# Patient Record
Sex: Female | Born: 1960 | Race: Black or African American | Hispanic: No
Health system: Midwestern US, Community
[De-identification: ages and names within clinical notes are randomized; demographics above are authoritative.]

## PROBLEM LIST (undated history)

## (undated) DIAGNOSIS — Z1231 Encounter for screening mammogram for malignant neoplasm of breast: Secondary | ICD-10-CM

## (undated) DIAGNOSIS — K529 Noninfective gastroenteritis and colitis, unspecified: Secondary | ICD-10-CM

## (undated) DIAGNOSIS — K219 Gastro-esophageal reflux disease without esophagitis: Secondary | ICD-10-CM

## (undated) DIAGNOSIS — M069 Rheumatoid arthritis, unspecified: Secondary | ICD-10-CM

## (undated) DIAGNOSIS — I10 Essential (primary) hypertension: Secondary | ICD-10-CM

## (undated) HISTORY — PX: BREAST EXCISIONAL BIOPSY: SUR124

---

## 2008-10-12 LAB — CBC WITH AUTOMATED DIFF
ABS. BASOPHILS: 0.1 10*3/uL (ref 0.0–0.1)
ABS. LYMPHOCYTES: 1.7 10*3/uL (ref 0.8–3.5)
ABS. MONOCYTES: 0.3 10*3/uL (ref 0–1.0)
ABS. NEUTROPHILS: 3.5 10*3/uL (ref 1.8–8.0)
BASOPHILS: 1 % (ref 0–3)
EOSINOPHILS: 5 % (ref 0–5)
HCT: 37.3 % (ref 36.0–46.0)
HGB: 12.3 g/dL (ref 12.0–16.0)
LYMPHOCYTES: 28 % (ref 20–51)
MCH: 29.6 PG (ref 25.0–35.0)
MCHC: 33 g/dL (ref 31.0–37.0)
MCV: 89.6 FL (ref 78.0–102.0)
MONOCYTES: 6 % (ref 2–9)
MPV: 8.7 FL (ref 7.4–10.4)
NEUTROPHILS: 60 % (ref 42–75)
PLATELET: 243 10*3/uL (ref 130–400)
RBC: 4.16 M/uL (ref 4.10–5.10)
RDW: 12.9 % (ref 11.5–14.5)
WBC: 5.9 10*3/uL (ref 4.5–13.0)

## 2008-10-12 LAB — METABOLIC PANEL, BASIC
Anion gap: 10 mmol/L (ref 5–15)
BUN/Creatinine ratio: 16 (ref 12–20)
BUN: 11 MG/DL (ref 7–18)
CO2: 28 MMOL/L (ref 21–32)
Calcium: 9.3 MG/DL (ref 8.4–10.4)
Chloride: 110 MMOL/L — ABNORMAL HIGH (ref 100–108)
Creatinine: 0.7 MG/DL (ref 0.6–1.3)
GFR est AA: >60 mL/min/{1.73_m2} (ref >60)
GFR est non-AA: >60 mL/min/{1.73_m2} (ref >60)
Glucose: 92 MG/DL (ref 74–99)
Potassium: 4 MMOL/L (ref 3.5–5.5)
Sodium: 148 MMOL/L — ABNORMAL HIGH (ref 136–145)

## 2008-10-12 LAB — SED RATE (ESR): Sed rate (ESR): 43 MM/HR — ABNORMAL HIGH (ref 0–20)

## 2008-10-13 LAB — ANA BY MULTIPLEX FLOW IA, QL
ANA, Direct: NOT DETECTED
ANA: NOT DETECTED

## 2008-12-24 LAB — METABOLIC PANEL, BASIC
Anion gap: 7 mmol/L (ref 5–15)
BUN/Creatinine ratio: 14 (ref 12–20)
BUN: 14 MG/DL (ref 7–18)
CO2: 28 MMOL/L (ref 21–32)
Calcium: 9.4 MG/DL (ref 8.4–10.4)
Chloride: 105 MMOL/L (ref 100–108)
Creatinine: 1 MG/DL (ref 0.6–1.3)
GFR est AA: >60 mL/min/{1.73_m2} (ref >60)
GFR est non-AA: >60 mL/min/{1.73_m2} (ref >60)
Glucose: 87 MG/DL (ref 74–99)
Potassium: 3.9 MMOL/L (ref 3.5–5.5)
Sodium: 140 MMOL/L (ref 136–145)

## 2008-12-24 LAB — CBC WITH AUTOMATED DIFF
ABS. BASOPHILS: 0 10*3/uL (ref 0.0–0.1)
ABS. LYMPHOCYTES: 1.2 10*3/uL (ref 0.8–3.5)
ABS. MONOCYTES: 0.4 10*3/uL (ref 0–1.0)
ABS. NEUTROPHILS: 3.2 10*3/uL (ref 1.8–8.0)
BASOPHILS: 1 % (ref 0–3)
EOSINOPHILS: 7 % — ABNORMAL HIGH (ref 0–5)
HCT: 35.8 % — ABNORMAL LOW (ref 36.0–46.0)
HGB: 11.8 g/dL — ABNORMAL LOW (ref 12.0–16.0)
LYMPHOCYTES: 24 % (ref 20–51)
MCH: 29.8 PG (ref 25.0–35.0)
MCHC: 32.9 g/dL (ref 31.0–37.0)
MCV: 90.3 FL (ref 78.0–102.0)
MONOCYTES: 7 % (ref 2–9)
MPV: 8.7 FL (ref 7.4–10.4)
NEUTROPHILS: 61 % (ref 42–75)
PLATELET: 259 10*3/uL (ref 130–400)
RBC: 3.97 M/uL — ABNORMAL LOW (ref 4.10–5.10)
RDW: 13 % (ref 11.5–14.5)
WBC: 5.3 10*3/uL (ref 4.5–13.0)

## 2008-12-24 LAB — HEPATIC FUNCTION PANEL
A-G Ratio: 1 (ref 0.8–1.7)
ALT (SGPT): 24 U/L — ABNORMAL LOW (ref 30–65)
AST (SGOT): 16 U/L (ref 15–37)
Albumin: 3.8 g/dL (ref 3.4–5.0)
Alk. phosphatase: 171 U/L — ABNORMAL HIGH (ref 50–136)
Bilirubin, direct: 0.1 MG/DL (ref 0.0–0.3)
Bilirubin, total: 0.4 MG/DL (ref 0.1–0.9)
Globulin: 3.8 g/dL (ref 2.0–4.0)
Protein, total: 7.6 g/dL (ref 6.4–8.2)

## 2008-12-24 LAB — TSH 3RD GENERATION: TSH: 0.48 u[IU]/mL — ABNORMAL LOW (ref 0.51–6.27)

## 2008-12-24 LAB — FERRITIN: Ferritin: 63 NG/ML (ref 10–291)

## 2008-12-24 LAB — URIC ACID: Uric acid: 3.2 MG/DL (ref 2.6–7.2)

## 2008-12-24 LAB — LIPID PANEL
CHOL/HDL Ratio: 3.2 (ref 0–5.0)
Cholesterol, total: 205 MG/DL — ABNORMAL HIGH (ref 0–200)
HDL Cholesterol: 64 MG/DL — ABNORMAL HIGH (ref 40–60)
LDL, calculated: 129.2 MG/DL — ABNORMAL HIGH (ref 0–100)
Triglyceride: 59 MG/DL (ref 0–150)
VLDL, calculated: 11.8 MG/DL

## 2008-12-24 LAB — RPR
RPR: NONREACTIVE
RPR: NONREACTIVE

## 2008-12-24 LAB — RHEUMATOID FACTOR, QL: Rheumatoid factor, QL: 1:64 {titer} — AB

## 2008-12-26 LAB — HEPATITIS C AB
Hep C virus Ab Interp.: NEGATIVE
Hepatitis C virus Ab: 0.05 Index (ref <0.80)

## 2008-12-26 LAB — HIV 1/2 AB SCREEN W RFLX CONFIRM
HIV 1/2 Interpretation: NONREACTIVE
HIV1/2 INTERPRETATION, HHIVI: NONREACTIVE

## 2008-12-26 LAB — ANA BY MULTIPLEX FLOW IA, QL
ANA, Direct: NOT DETECTED
ANA: NOT DETECTED

## 2008-12-26 LAB — HEPATITIS C ANTIBODY
HCV Ab: 0.05 Index (ref <0.80)
Hepatitis C Ab: NEGATIVE

## 2010-05-01 LAB — METABOLIC PANEL, COMPREHENSIVE
A-G Ratio: 1.2 (ref 0.8–1.7)
ALT (SGPT): 28 U/L — ABNORMAL LOW (ref 30–65)
AST (SGOT): 8 U/L — ABNORMAL LOW (ref 15–37)
Albumin: 3.8 g/dL (ref 3.4–5.0)
Alk. phosphatase: 115 U/L (ref 50–136)
Anion gap: 8 mmol/L (ref 5–15)
BUN/Creatinine ratio: 14 (ref 12–20)
BUN: 14 MG/DL (ref 7–18)
Bilirubin, total: 0.3 MG/DL (ref 0.2–1.0)
CO2: 28 MMOL/L (ref 21–32)
Calcium: 8.8 MG/DL (ref 8.4–10.4)
Chloride: 107 MMOL/L (ref 100–108)
Creatinine: 1 MG/DL (ref 0.6–1.3)
GFR est AA: >60 mL/min/{1.73_m2} (ref >60)
GFR est non-AA: >60 mL/min/{1.73_m2} (ref >60)
Globulin: 3.3 g/dL (ref 2.0–4.0)
Glucose: 95 MG/DL (ref 74–99)
Potassium: 4.5 MMOL/L (ref 3.5–5.5)
Protein, total: 7.1 g/dL (ref 6.4–8.2)
Sodium: 143 MMOL/L (ref 136–145)

## 2010-05-01 LAB — CBC WITH AUTOMATED DIFF
ABS. BASOPHILS: 0 10*3/uL (ref 0.0–0.06)
ABS. EOSINOPHILS: 0.2 10*3/uL (ref 0.0–0.4)
ABS. LYMPHOCYTES: 3.3 10*3/uL (ref 0.9–3.6)
ABS. MONOCYTES: 0.3 10*3/uL (ref 0.05–1.2)
ABS. NEUTROPHILS: 3.5 10*3/uL (ref 1.8–8.0)
BASOPHILS: 0 % (ref 0–2)
EOSINOPHILS: 3 % (ref 0–5)
HCT: 37.2 % (ref 35.0–45.0)
HGB: 12.5 g/dL (ref 12.0–16.0)
LYMPHOCYTES: 45 % (ref 21–52)
MCH: 29.1 PG (ref 24.0–34.0)
MCHC: 33.6 g/dL (ref 31.0–37.0)
MCV: 86.7 FL (ref 74.0–97.0)
MONOCYTES: 4 % (ref 3–10)
MPV: 10.4 FL (ref 9.2–11.8)
NEUTROPHILS: 48 % (ref 40–73)
PLATELET: 270 10*3/uL (ref 135–420)
RBC: 4.29 M/uL (ref 4.20–5.30)
RDW: 14.4 % (ref 11.6–14.5)
WBC: 7.3 10*3/uL (ref 4.6–13.2)

## 2010-05-01 LAB — LIPID PANEL
CHOL/HDL Ratio: 3.8 (ref 0–5.0)
Cholesterol, total: 205 MG/DL — ABNORMAL HIGH (ref 0–200)
HDL Cholesterol: 54 MG/DL (ref 40–60)
LDL, calculated: 137 MG/DL — ABNORMAL HIGH (ref 0–100)
Triglyceride: 70 MG/DL (ref 0–150)
VLDL, calculated: 14 MG/DL

## 2010-05-01 LAB — TSH 3RD GENERATION: TSH: 1.79 u[IU]/mL (ref 0.51–6.27)

## 2010-05-03 LAB — VITAMIN D, 1, 25 DIHYDROXY: Calcitriol (Vit D 1, 25 di-OH): 64.3 pg/mL (ref 10.0–75.0)

## 2010-11-02 ENCOUNTER — Encounter

## 2010-12-18 MED ORDER — MIDAZOLAM 1 MG/ML IJ SOLN
1 mg/mL | INTRAMUSCULAR | Status: DC | PRN
Start: 2010-12-18 — End: 2010-12-18
  Administered 2010-12-18 (×2): via INTRAVENOUS

## 2010-12-18 MED ORDER — FLUMAZENIL 0.1 MG/ML IV SOLN
0.1 mg/mL | INTRAVENOUS | Status: DC | PRN
Start: 2010-12-18 — End: 2010-12-18

## 2010-12-18 MED ORDER — NALOXONE 0.4 MG/ML INJECTION
0.4 mg/mL | INTRAMUSCULAR | Status: DC | PRN
Start: 2010-12-18 — End: 2010-12-18

## 2010-12-18 MED ORDER — MEPERIDINE (PF) 100 MG/ML INJ SOLUTION
100 mg/ml | INTRAMUSCULAR | Status: DC | PRN
Start: 2010-12-18 — End: 2010-12-18
  Administered 2010-12-18: 16:00:00 via INTRAVENOUS

## 2010-12-18 MED ADMIN — meperidine (DEMEROL) 100 mg/ml injection: INTRAVENOUS | @ 16:00:00 | NDC 00409118069

## 2010-12-18 MED ADMIN — midazolam (VERSED) 1 mg/mL injection: INTRAVENOUS | @ 16:00:00 | NDC 00641605901

## 2010-12-18 MED ADMIN — 0.9% sodium chloride infusion 500 mL: INTRAVENOUS | @ 15:00:00 | NDC 00409798303

## 2010-12-18 NOTE — Procedures (Signed)
Colonoscopy Procedure Note    Patient: Nichole Lee MRN: [id number]  SSN: [id number]    Date of Birth: 04/09/1960  Age: 50 y.o.  Sex: female      Date of Procedure: 12/18/2010      Procedures:  COLONOSCOPY:   HISTORY UPDATE: There have been no significant clinical changes since the completion of the originally dated History and Physical.   INDICATION:  Screening colonoscopy   PROCEDURE PERFORMED:Colonoscopy   ENDOSCOPIST: Elberta Spaniel, MD   ASSISTANT:  Beacher May - Endoscopy Technician-1  Rondel Jumbo, RN - Endoscopy RN-1   CLASSIFICATION OF PREOPERATIVE RISK: ASA 2 - Patient with mild systemic disease with no functional limitations   ANESTHESIA:  Demerol 75 mg IV and Versed 3 mg IV   ENDOSCOPE:                                                                                                                 [x]   CF H 180AL   []   PCF H180AL   []   GIF H 180  []   CFQ 180 AL            EXTENT OF EXAM: Terminal ileum    QUALITY OF COLON PREPARATION:  good     DESCRIPTION OF PROCEDURE:  The procedure was discussed with the      patient including but not limited to IV conscious sedation, bleeding, perforation and missed polyps and the consent form was signed and witnessed.  A safety timeout was performed.  The patient was then given incremental doses of intravenous demerol and versed to achieve moderate conscious sedation.  The patient's vitals were monitored at all times, including heart rate and rhythm, oxygen saturation, and blood pressure.  The patient was then placed into the left lateral decubitus position.  A rectal exam was performed.  The Olympus Adult diagnostic colonscope was then passed under direct visualization with ease to the terminal ileum.  The cecum was identified by landmarks including Ileocecal valve, appendiceal orifice and crow's foot.  The scope was then slowly withdrawn while closely visualizing the mucosa in the rectum a retroflection was performed and distal rectum visualized.  The scope was then removed.  The patient was transferred to the recovery room in stable condition.      FINDINGS:1. Mild diverticulosis on the left side 2. Small internal hemorrhoids on retroflexion 3. Terminal ileum normal to 20 cm    EBL: None   SPECIMENS:None   IMPRESSION: 1. Mild diverticulosis on the left side 2. Small internal hemorrhoids on retroflexion 3. Terminal ileum normal to 20 cm    PLAN 1: Discharge when sedation criteria are met. 2.  Resume High fibre Diet as tolerated.   Preventive Medicine Counseling:     Diet/Adult weight BMI management provided:  Yes   Follow Up:            As needed      Elberta Spaniel, MD   12/18/2010

## 2010-12-18 NOTE — Procedures (Signed)
Procedures  by Elberta Spaniel, MD at 12/18/10 1119                Author: Elberta Spaniel, MD  Service: --  Author Type: Physician       Filed: 12/18/10 1127  Date of Service: 12/18/10 1119  Status: Signed          Editor: Elberta Spaniel, MD (Physician)            Pre-procedure Diagnoses        1. Encounter for screening colonoscopy [V76.51]                           Post-procedure Diagnoses        1. Diverticulosis of colon [562.10]        2. Internal hemorrhoids without complication [455.0]                           Procedures        1. COLONOSCOPY [ZOX0960 (Custom)]                                               Colonoscopy Procedure Note          Patient: Nichole Lee  MRN: [id number]   SSN: [id number]          Date of Birth: 10/27/1960   Age: 49 y.o.   Sex: female         Date of Procedure: 12/18/2010         Procedures:   COLONOSCOPY:    HISTORY UPDATE: There have been no significant clinical changes since the completion of the originally dated History and Physical.    INDICATION:  Screening colonoscopy    PROCEDURE PERFORMED:Colonoscopy    ENDOSCOPIST: Elberta Spaniel, MD    ASSISTANT:  Beacher May - Endoscopy Technician-1   Rondel Jumbo, RN - Endoscopy RN-1    CLASSIFICATION OF PREOPERATIVE RISK: ASA 2 - Patient with mild systemic disease with no functional limitations    ANESTHESIA:  Demerol 75 mg IV and Versed 3 mg IV    ENDOSCOPE:                                                                                                                        [x]   CF H 180AL    []   PCF H180AL    []   GIF H 180    []   CFQ 180 AL                       EXTENT OF EXAM: Terminal ileum     QUALITY OF COLON PREPARATION:  good      DESCRIPTION OF PROCEDURE:  The procedure was discussed with the  patient including but not limited to IV conscious sedation, bleeding, perforation and missed polyps and the consent form was signed and witnessed.  A safety timeout was performed.  The patient was then given incremental  doses of intravenous demerol  and versed to achieve moderate conscious sedation.  The patient's vitals were monitored at all times, including heart rate and rhythm, oxygen saturation, and blood pressure.  The patient was then placed into the left lateral decubitus position.  A rectal  exam was performed.  The Olympus Adult diagnostic colonscope was then passed under direct visualization with ease to the terminal ileum.  The cecum was identified by landmarks including Ileocecal valve, appendiceal orifice and crow's foot.  The scope  was then slowly withdrawn while closely visualizing the mucosa in the rectum a retroflection was performed and distal rectum visualized.  The scope was then removed.  The patient was transferred to the recovery room in stable condition.       FINDINGS:1. Mild diverticulosis on the left side 2. Small internal hemorrhoids on retroflexion 3. Terminal ileum normal to 20 cm     EBL: None    SPECIMENS:None    IMPRESSION: 1. Mild diverticulosis on the left side 2. Small internal hemorrhoids on retroflexion 3. Terminal ileum normal to 20 cm     PLAN 1: Discharge when sedation criteria are met. 2.  Resume High fibre Diet as tolerated.   Preventive Medicine  Counseling:      Diet/Adult weight BMI management provided:  Yes    Follow Up:             As needed        Elberta Spaniel, MD    12/18/2010

## 2011-08-01 LAB — LIPID PANEL
CHOL/HDL Ratio: 4.5 (ref 0–5.0)
Cholesterol, total: 232 MG/DL — ABNORMAL HIGH (ref <200)
HDL Cholesterol: 51 MG/DL (ref 40–60)
LDL, calculated: 147.6 MG/DL — ABNORMAL HIGH (ref 0–100)
Triglyceride: 167 MG/DL — ABNORMAL HIGH (ref <150)
VLDL, calculated: 33.4 MG/DL

## 2011-08-01 LAB — METABOLIC PANEL, COMPREHENSIVE
A-G Ratio: 1 (ref 0.8–1.7)
ALT (SGPT): 30 U/L (ref 12.0–78.0)
AST (SGOT): 23 U/L (ref 15–37)
Albumin: 4 g/dL (ref 3.4–5.0)
Alk. phosphatase: 145 U/L — ABNORMAL HIGH (ref 50–136)
Anion gap: 13 mmol/L (ref 3.0–18)
BUN/Creatinine ratio: 13 (ref 12–20)
BUN: 15 MG/DL (ref 7.0–18)
Bilirubin, total: 0.2 MG/DL (ref 0.2–1.0)
CO2: 26 MMOL/L (ref 21–32)
Calcium: 9.6 MG/DL (ref 8.5–10.1)
Chloride: 102 MMOL/L (ref 100–108)
Creatinine: 1.13 MG/DL (ref 0.6–1.3)
GFR est AA: >60 mL/min/{1.73_m2} (ref >60)
GFR est non-AA: 54 mL/min/{1.73_m2} — ABNORMAL LOW (ref >60)
Globulin: 4.1 g/dL — ABNORMAL HIGH (ref 2.0–4.0)
Glucose: 83 MG/DL (ref 74–99)
Potassium: 3.7 MMOL/L (ref 3.5–5.5)
Protein, total: 8.1 g/dL (ref 6.4–8.2)
Sodium: 141 MMOL/L (ref 136–145)

## 2011-08-01 LAB — CBC WITH AUTOMATED DIFF
ABS. BASOPHILS: 0 10*3/uL (ref 0.0–0.06)
ABS. EOSINOPHILS: 0.3 10*3/uL (ref 0.0–0.4)
ABS. LYMPHOCYTES: 2.1 10*3/uL (ref 0.9–3.6)
ABS. MONOCYTES: 0.5 10*3/uL (ref 0.05–1.2)
ABS. NEUTROPHILS: 3.3 10*3/uL (ref 1.8–8.0)
BASOPHILS: 1 % (ref 0–2)
EOSINOPHILS: 5 % (ref 0–5)
HCT: 33.3 % — ABNORMAL LOW (ref 35.0–45.0)
HGB: 11.2 g/dL — ABNORMAL LOW (ref 12.0–16.0)
LYMPHOCYTES: 33 % (ref 21–52)
MCH: 29.6 PG (ref 24.0–34.0)
MCHC: 33.6 g/dL (ref 31.0–37.0)
MCV: 88.1 FL (ref 74.0–97.0)
MONOCYTES: 8 % (ref 3–10)
MPV: 10.3 FL (ref 9.2–11.8)
NEUTROPHILS: 53 % (ref 40–73)
PLATELET: 302 10*3/uL (ref 135–420)
RBC: 3.78 M/uL — ABNORMAL LOW (ref 4.20–5.30)
RDW: 13.5 % (ref 11.6–14.5)
WBC: 6.2 10*3/uL (ref 4.6–13.2)

## 2011-08-01 LAB — TSH 3RD GENERATION: TSH: 0.83 u[IU]/mL (ref 0.51–6.27)

## 2011-08-02 LAB — VITAMIN D, 25 HYDROXY: Vitamin D 25-Hydroxy: 27.8 ng/mL — ABNORMAL LOW (ref 30–100)

## 2012-10-16 LAB — CBC WITH AUTOMATED DIFF
ABS. BASOPHILS: 0 10*3/uL (ref 0.0–0.06)
ABS. EOSINOPHILS: 0 10*3/uL (ref 0.0–0.4)
ABS. LYMPHOCYTES: 1.1 10*3/uL (ref 0.9–3.6)
ABS. MONOCYTES: 0.4 10*3/uL (ref 0.05–1.2)
ABS. NEUTROPHILS: 12.4 10*3/uL — ABNORMAL HIGH (ref 1.8–8.0)
BASOPHILS: 0 % (ref 0–2)
EOSINOPHILS: 0 % (ref 0–5)
HCT: 36.3 % (ref 35.0–45.0)
HGB: 12.7 g/dL (ref 12.0–16.0)
LYMPHOCYTES: 8 % — ABNORMAL LOW (ref 21–52)
MCH: 28.2 PG (ref 24.0–34.0)
MCHC: 35 g/dL (ref 31.0–37.0)
MCV: 80.5 FL (ref 74.0–97.0)
MONOCYTES: 3 % (ref 3–10)
MPV: 9.5 FL (ref 9.2–11.8)
NEUTROPHILS: 89 % — ABNORMAL HIGH (ref 40–73)
PLATELET: 323 10*3/uL (ref 135–420)
RBC: 4.51 M/uL (ref 4.20–5.30)
RDW: 13.2 % (ref 11.6–14.5)
WBC: 13.9 10*3/uL — ABNORMAL HIGH (ref 4.6–13.2)

## 2012-10-16 LAB — METABOLIC PANEL, COMPREHENSIVE
A-G Ratio: 0.9 (ref 0.8–1.7)
ALT (SGPT): 48 U/L (ref 13–56)
AST (SGOT): 51 U/L — ABNORMAL HIGH (ref 15–37)
Albumin: 4 g/dL (ref 3.4–5.0)
Alk. phosphatase: 102 U/L (ref 45–117)
Anion gap: 12 mmol/L (ref 3.0–18)
BUN/Creatinine ratio: 17 (ref 12–20)
BUN: 16 MG/DL (ref 7.0–18)
Bilirubin, total: 0.3 MG/DL (ref 0.2–1.0)
CO2: 21 mmol/L (ref 21–32)
Calcium: 9.9 MG/DL (ref 8.5–10.1)
Chloride: 106 mmol/L (ref 100–108)
Creatinine: 0.95 MG/DL (ref 0.6–1.3)
GFR est AA: >60 mL/min/{1.73_m2} (ref >60)
GFR est non-AA: >60 mL/min/{1.73_m2} (ref >60)
Globulin: 4.3 g/dL — ABNORMAL HIGH (ref 2.0–4.0)
Glucose: 137 mg/dL — ABNORMAL HIGH (ref 74–99)
Potassium: 3.9 mmol/L (ref 3.5–5.5)
Protein, total: 8.3 g/dL — ABNORMAL HIGH (ref 6.4–8.2)
Sodium: 139 mmol/L (ref 136–145)

## 2012-10-16 LAB — HGB & HCT
HCT: 37.9 % (ref 35.0–45.0)
HCT: 34.9 % — ABNORMAL LOW (ref 35.0–45.0)
HGB: 13 g/dL (ref 12.0–16.0)
HGB: 12.2 g/dL (ref 12.0–16.0)

## 2012-10-16 LAB — LIPASE: Lipase: 91 U/L (ref 73–393)

## 2012-10-16 LAB — LACTIC ACID: Lactic acid: 1.4 MMOL/L (ref 0.4–2.0)

## 2012-10-16 MED ADMIN — dicyclomine (BENTYL) capsule 10 mg: ORAL | @ 22:00:00 | NDC 51079011801

## 2012-10-16 MED ADMIN — morphine injection 4 mg: INTRAVENOUS | @ 07:00:00 | NDC 00409189101

## 2012-10-16 MED ADMIN — morphine injection 4 mg: INTRAVENOUS | @ 17:00:00 | NDC 00409189001

## 2012-10-16 MED ADMIN — dicyclomine (BENTYL) capsule 10 mg: ORAL | @ 13:00:00 | NDC 51079011801

## 2012-10-16 MED ADMIN — methotrexate (RHEUMATREX) tablet 2.5mg - Need to verify patient's dose (How frequent): ORAL | @ 14:00:00 | NDC 72162217401

## 2012-10-16 MED ADMIN — levofloxacin (LEVAQUIN) 500 mg in D5W IVPB: INTRAVENOUS | @ 11:00:00 | NDC 25021013267

## 2012-10-16 MED ADMIN — dicyclomine (BENTYL) capsule 10 mg: ORAL | @ 17:00:00 | NDC 51079011801

## 2012-10-16 MED ADMIN — morphine injection 4 mg: INTRAVENOUS | @ 11:00:00 | NDC 00409189101

## 2012-10-16 MED ADMIN — pantoprazole (PROTONIX) injection 40 mg: INTRAVENOUS | @ 12:00:00 | NDC 63323018610

## 2012-10-16 MED ADMIN — morphine injection 4 mg: INTRAVENOUS | @ 13:00:00 | NDC 00409189001

## 2012-10-16 MED ADMIN — 0.45% sodium chloride infusion: INTRAVENOUS | @ 20:00:00 | NDC 00409798509

## 2012-10-16 MED ADMIN — ioversol (OPTIRAY) 320 mg iodine/mL contrast injection 1-125 mL: INTRAVENOUS | @ 09:00:00 | NDC 00019132387

## 2012-10-16 NOTE — H&P (Addendum)
GENERAL GENERIC H&P    HPI:    52 year old female with pmh hypertension, RA, GERD, arthritis, comes with abd pain since yesterday started 6 PM  Constant acute 10 out of 10, 3 episodes of bloody loose stools, with few clots, feels nauseous no vomiting no fever no chills no dizziness, non smoker no drinking no marijuana use. No vaginal discharge no recent travel.  Admitted for further eval and treatment  Lactic acid is pending.    Past Medical History   Diagnosis Date   ??? Hypertension    ??? Gonorrhea    ??? Arthritis    ??? GERD (gastroesophageal reflux disease)       History reviewed. No pertinent past surgical history.   Prior to Admission medications    Medication Sig Start Date End Date Taking? Authorizing Provider   methotrexate (RHEUMATREX) 2.5 mg tablet Take 2.5 mg by mouth.      Historical Provider     No Known Allergies   History   Substance Use Topics   ??? Smoking status: Never Smoker    ??? Smokeless tobacco: Not on file   ??? Alcohol Use: No      History reviewed. No pertinent family history.   Review of Systems   Constitutional: Negative for chills, diaphoresis and fatigue.   HENT: Negative for facial swelling, neck pain and neck stiffness.    Eyes: Negative for pain, discharge and itching.   Respiratory: Negative for apnea, cough, choking, chest tightness and shortness of breath.    Cardiovascular: Negative for chest pain, palpitations and leg swelling.   Gastrointestinal: Positive for nausea, abdominal pain, diarrhea and blood in stool. Negative for constipation, abdominal distention and rectal pain.   Endocrine: Negative for cold intolerance.   Genitourinary: Negative for dysuria, difficulty urinating and dyspareunia.   Musculoskeletal: Negative for back pain, arthralgias and gait problem.   Skin: Negative for color change.   Neurological: Negative for dizziness, facial asymmetry and headaches.   Hematological: Negative for adenopathy.   Psychiatric/Behavioral: Negative for behavioral problems and agitation. The  patient is nervous/anxious.                  Patient Vitals for the past 8 hrs:   BP Temp Pulse Resp SpO2 Height Weight   10/16/12 0415 - - 84 16 96 % - -   10/16/12 0400 166/91 mmHg - 84 16 97 % - -   10/16/12 0345 - - 81 16 95 % - -   10/16/12 0330 159/92 mmHg - 82 17 93 % - -   10/16/12 0315 - - 85 17 92 % - -   10/16/12 0300 155/92 mmHg - 82 16 90 % - -   10/16/12 0245 - - 78 20 95 % - -   10/16/12 0230 177/96 mmHg - 73 23 100 % - -   10/16/12 0215 170/92 mmHg - 74 32 100 % - -   10/16/12 0200 168/108 mmHg 99.4 ??F (37.4 ??C) 91 34 100 % 5\' 4"  (1.626 m) 65.772 kg (145 lb)   10/16/12 0157 - - 88 32 100 % - -   10/16/12 0155 160/103 mmHg - - - - - -     Physical Exam   Nursing note and vitals reviewed.  Constitutional: She is oriented to person, place, and time.   Well developed female lying on bed, moaning and in extreme distress due to pain   HENT:   Head: Normocephalic and atraumatic.  Eyes: Conjunctivae and EOM are normal. Pupils are equal, round, and reactive to light.   Neck: Normal range of motion. Neck supple. No tracheal deviation present.   Cardiovascular: Regular rhythm, normal heart sounds and intact distal pulses.  Exam reveals no friction rub.    No murmur heard.  Rate 96 per min   Pulmonary/Chest: Effort normal and breath sounds normal. She has no wheezes.   Abdominal: Bowel sounds are normal. She exhibits no mass. There is no rebound and no guarding.   Firm, tender throughout, pain is out of proportion to physical exam   Musculoskeletal: Normal range of motion. She exhibits no edema.   Neurological: She is alert and oriented to person, place, and time. She has normal reflexes. No cranial nerve deficit.   Skin: Skin is warm and dry. No rash noted. No erythema.   Psychiatric: She has a normal mood and affect. Her behavior is normal. Judgment and thought content normal.        Labs:    Recent Results (from the past 24 hour(s))   CBC WITH AUTOMATED DIFF    Collection Time     10/16/12  2:39 AM        Result Value Range    WBC 13.9 (*) 4.6 - 13.2 K/uL    RBC 4.51  4.20 - 5.30 M/uL    HGB 12.7  12.0 - 16.0 g/dL    HCT 16.1  09.6 - 04.5 %    MCV 80.5  74.0 - 97.0 FL    MCH 28.2  24.0 - 34.0 PG    MCHC 35.0  31.0 - 37.0 g/dL    RDW 40.9  81.1 - 91.4 %    PLATELET 323  135 - 420 K/uL    MPV 9.5  9.2 - 11.8 FL    NEUTROPHILS 89 (*) 40 - 73 %    LYMPHOCYTES 8 (*) 21 - 52 %    MONOCYTES 3  3 - 10 %    EOSINOPHILS 0  0 - 5 %    BASOPHILS 0  0 - 2 %    ABS. NEUTROPHILS 12.4 (*) 1.8 - 8.0 K/UL    ABS. LYMPHOCYTES 1.1  0.9 - 3.6 K/UL    ABS. MONOCYTES 0.4  0.05 - 1.2 K/UL    ABS. EOSINOPHILS 0.0  0.0 - 0.4 K/UL    ABS. BASOPHILS 0.0  0.0 - 0.06 K/UL    DF AUTOMATED     METABOLIC PANEL, COMPREHENSIVE    Collection Time     10/16/12  2:39 AM       Result Value Range    Sodium 139  136 - 145 mmol/L    Potassium 3.9  3.5 - 5.5 mmol/L    Chloride 106  100 - 108 mmol/L    CO2 21  21 - 32 mmol/L    Anion gap 12  3.0 - 18 mmol/L    Glucose 137 (*) 74 - 99 mg/dL    BUN 16  7.0 - 18 MG/DL    Creatinine 7.82  0.6 - 1.3 MG/DL    BUN/Creatinine ratio 17  12 - 20      GFR est AA >60  >60 ml/min/1.63m2    GFR est non-AA >60  >60 ml/min/1.72m2    Calcium 9.9  8.5 - 10.1 MG/DL    Bilirubin, total 0.3  0.2 - 1.0 MG/DL    ALT 48  13 - 56 U/L    AST 51 (*)  15 - 37 U/L    Alk. phosphatase 102  45 - 117 U/L    Protein, total 8.3 (*) 6.4 - 8.2 g/dL    Albumin 4.0  3.4 - 5.0 g/dL    Globulin 4.3 (*) 2.0 - 4.0 g/dL    A-G Ratio 0.9  0.8 - 1.7     LIPASE    Collection Time     10/16/12  2:39 AM       Result Value Range    Lipase 91  73 - 393 U/L       CT imaging reviewed with ER physician     Assessmentn and plan:  Active Problems:    Colitis (10/16/2012)    1 bleeding per rectum:  Associated abd pain  Possible colitis  Levofloxacin and Flagyl  H&h every 6 hours  GI consult  IV fluids  Npo except ice chips  Pain control with iv morphine 4 mg iv q 4 hours prn  Iv protonix  Possible intervention    2 hypertension uncontrolled;  Prn hydralazine for SBP  more than 180 mm of Hg  Resume lisinopril-hctz as soon as she can take po    3 RA - resume plaquenil  Once takes po    4 arthritis - currently on prn morphine for abd pain which should take care of arthritic pain    5 sepsis secondary to colitis-  Patient has leukocytosis, evidence of infection, and sinus tachycardia greater than 90 per minute  Blood cultures sent  Lactic acid being collected  On iv fluids, on levaquin and flagyl    DVT ppx  - mechanical  Diet - npo except ice chips  Consult - GI consult called by ER  Code - full code  PIV    Time spent - 65 minutes        Signed:  Italo Banton A. Luan Pulling, MD 10/16/2012

## 2012-10-16 NOTE — Progress Notes (Addendum)
Patient received in bed resting quietly family at bedside. Patient Alert and oriented X4. No s/s of distress. Complaints of abdominal cramping. Call bell and possessions within reach. BEd locked and in lowest position. Will continue to monitor.    1500> Reassessment completed. No changes noted at this time. Will continue to monitor.

## 2012-10-16 NOTE — ED Notes (Signed)
Report received from [facility], [place]. Pt lying in bed with eyes closed. IV Levaquin attached and running at 100 mL/hr, with 65 mL already infused. Pt on continuous cardiac monitor and pulse ox. V/S stable. No acute distress noted at this time. Family at bedside.

## 2012-10-16 NOTE — ED Notes (Signed)
TRANSFER - OUT REPORT:    Verbal report given to Allegra, RN (name) on Nichole Lee  being transferred to 2100 (unit) for routine progression of care       Report consisted of patient???s Situation, Background, Assessment and   Recommendations(SBAR).     Information from the following report(s) SBAR, ED Summary, MAR and Accordion was reviewed with the receiving nurse.    Opportunity for questions and clarification was provided.

## 2012-10-16 NOTE — Progress Notes (Signed)
Discharge Plan   [x] Home   [] Home with Home Health   [] Home with Hospice   [] Transfer to LTACH   [] Transfer to SNF   [] Transfer to Long Term Care   [] Transfer to Assisted Living   [] Transfer to Another Acute Care Setting______________   [] Transfer to Psychiatric Facility   [] Transfer to Acute Rehabilitation  Services offered:   [] Given List of Extended Care Facilities   [] Given List of Additional Care Providers   [] Given List of Home Health Providers   [] Freedom of Choice Given         Equipment Required/Requested:   [] Oxygen   [] Cpap/Bipap   [] Hospital Bed   [] Wheelchair   [] Walker   [] Commode   [] Other__________________________________________________  Medication:   [] Assistance Given   [] Indigent Drug Referrals Given  Resolution:   [x] Patient  and/or designated individual agreeable to discharge plan.   [] Patient and or family refuses discharge plan  Referrals:   [] Adult Protective Services   [] Alcohol or Drug Counseling   [] Suicide or Psychiatric Services   [] Shelter____________________________

## 2012-10-16 NOTE — Progress Notes (Signed)
Bedside and Verbal shift change report given to Wyman Songster, RN (oncoming nurse) by Jarvis Newcomer, RN.   (offgoing nurse). Report given with SBAR, Kardex, Intake/Output, MAR and Recent Results.

## 2012-10-16 NOTE — ED Notes (Signed)
Patient with c/o bright red blood  And states, "I think it's my hemmorrhoids."

## 2012-10-16 NOTE — ED Notes (Signed)
Assisted patient to restroom.

## 2012-10-16 NOTE — Consults (Signed)
Gastroenterology Consult    Patient: Nichole Lee MRN: [id number]  SSN: [id number]    Date of Birth: [birth year]  Age: 52 y.o.  Sex: female        Assessment:     1. Mild ischemic colitis. Last colon in 12/2010 - tics.  2. HTN.  3. H/O Migraines.  4. GERD    Plan:     1. Advance diet, stop antibiotics and PPI. Home when able to tolerate diet and pain is manageable (this can be later today). We will stand by and be available as needed. I expect her to have abdominal cramping for the next few weeks, slowly will diminish. Abx not needed for this. Colonoscopy not indicated.    Subjective:     Reason for consultation: Abd pain, diarrhea, bleeding, thick colon on CT    History: 52 year old Black female with a h/o  hypertension, RA, GERD, arthritis, developed lower abd pain (severe, in waves) yesterday at 6 PM associated with 3 episodes of bloody loose stools, with few clots. She felt nauseous no vomiting but no fever or chills. She does not smoke or drink. No drug ir decongestant use. No recent travel. No h/o similar episodes before. Colon in 12/2010 by Dr. Frederik Pear was negative.      Hospital Problems Date Reviewed: 12/18/2010        ICD-9-CM Class Noted POA    Colitis 558.9  10/16/2012 Unknown            Past Medical History   Diagnosis Date   ??? Hypertension    ??? Gonorrhea    ??? Arthritis    ??? GERD (gastroesophageal reflux disease)      History reviewed. No pertinent past surgical history.   History reviewed. No pertinent family history.  History   Substance Use Topics   ??? Smoking status: Never Smoker    ??? Smokeless tobacco: Not on file   ??? Alcohol Use: No      Current Facility-Administered Medications   Medication Dose Route Frequency Provider Last Rate Last Dose   ??? [COMPLETED] morphine injection 4 mg  4 mg IntraVENous NOW Jerre Simon, MD   4 mg at 10/16/12 0239   ??? [COMPLETED] ioversol (OPTIRAY) 320 mg iodine/mL contrast injection 1-125 mL  1-125 mL IntraVENous RAD ONCE Jerre Simon, MD   100 mL at 10/16/12 0436    ??? [COMPLETED] morphine injection 4 mg  4 mg IntraVENous NOW Jerre Simon, MD   4 mg at 10/16/12 0640   ??? [COMPLETED] levofloxacin (LEVAQUIN) 500 mg in D5W IVPB  500 mg IntraVENous NOW Jerre Simon, MD   500 mg at 10/16/12 0643   ??? .PHARMACY TO SUBSTITUTE PER PROTOCOL     Vishal A Vasavada, MD       ??? acetaminophen (TYLENOL) tablet 650 mg  650 mg Oral Q4H PRN Vishal A Vasavada, MD       ??? acetaminophen (TYLENOL) suppository 650 mg  650 mg Rectal Q4H PRN Vishal A Vasavada, MD       ??? acetaminophen (TYLENOL) solution 650 mg  650 mg Oral Q4H PRN Vishal A Vasavada, MD       ??? diphenhydrAMINE (BENADRYL) capsule 25 mg  25 mg Oral Q4H PRN Vishal A Vasavada, MD       ??? ondansetron (ZOFRAN) injection 4 mg  4 mg IntraVENous Q4H PRN Carollee Leitz, MD       ??? docusate sodium (COLACE) capsule 100 mg  100 mg Oral BID PRN Carollee Leitz, MD       ??? morphine injection 4 mg  4 mg IntraVENous Q4H PRN Vishal A Vasavada, MD       ??? dicyclomine (BENTYL) capsule 10 mg  10 mg Oral QID Zettie Cooley, MD            No Known Allergies    Review of Systems:  A comprehensive review of systems was negative except for that written in the History of Present Illness.    Objective:     Filed Vitals:    10/16/12 0645 10/16/12 0700 10/16/12 0730 10/16/12 0753   BP:  154/89 149/95    Pulse: 78 77 89    Temp:    99.2 ??F (37.3 ??C)   Resp: 20 14 15     Height:       Weight:       SpO2: 100% 95% 96%         Physical Exam:  GENERAL:  Middle aged [place] female, looks well. In no distress  HEENT:  No pallor  LUNGS:  CTA  HEART:  CTA, normal rhythm  ABDOMEN: soft, minimally tender in lower Labd, liver spleen are not palpable. There is no ascites that is obvious.  EXTREMITIES: no edema, rash  NEURO: grossly intact    Recent Results (from the past 72 hour(s))   CBC WITH AUTOMATED DIFF    Collection Time     10/16/12  2:39 AM       Result Value Range    WBC 13.9 (*) 4.6 - 13.2 K/uL    RBC 4.51  4.20 - 5.30 M/uL    HGB 12.7  12.0 - 16.0 g/dL    HCT 13.0  86.5  - 78.4 %    MCV 80.5  74.0 - 97.0 FL    MCH 28.2  24.0 - 34.0 PG    MCHC 35.0  31.0 - 37.0 g/dL    RDW 69.6  29.5 - 28.4 %    PLATELET 323  135 - 420 K/uL    MPV 9.5  9.2 - 11.8 FL    NEUTROPHILS 89 (*) 40 - 73 %    LYMPHOCYTES 8 (*) 21 - 52 %    MONOCYTES 3  3 - 10 %    EOSINOPHILS 0  0 - 5 %    BASOPHILS 0  0 - 2 %    ABS. NEUTROPHILS 12.4 (*) 1.8 - 8.0 K/UL    ABS. LYMPHOCYTES 1.1  0.9 - 3.6 K/UL    ABS. MONOCYTES 0.4  0.05 - 1.2 K/UL    ABS. EOSINOPHILS 0.0  0.0 - 0.4 K/UL    ABS. BASOPHILS 0.0  0.0 - 0.06 K/UL    DF AUTOMATED     METABOLIC PANEL, COMPREHENSIVE    Collection Time     10/16/12  2:39 AM       Result Value Range    Sodium 139  136 - 145 mmol/L    Potassium 3.9  3.5 - 5.5 mmol/L    Chloride 106  100 - 108 mmol/L    CO2 21  21 - 32 mmol/L    Anion gap 12  3.0 - 18 mmol/L    Glucose 137 (*) 74 - 99 mg/dL    BUN 16  7.0 - 18 MG/DL    Creatinine 1.32  0.6 - 1.3 MG/DL    BUN/Creatinine ratio 17  12 - 20  GFR est AA >60  >60 ml/min/1.1m2    GFR est non-AA >60  >60 ml/min/1.64m2    Calcium 9.9  8.5 - 10.1 MG/DL    Bilirubin, total 0.3  0.2 - 1.0 MG/DL    ALT 48  13 - 56 U/L    AST 51 (*) 15 - 37 U/L    Alk. phosphatase 102  45 - 117 U/L    Protein, total 8.3 (*) 6.4 - 8.2 g/dL    Albumin 4.0  3.4 - 5.0 g/dL    Globulin 4.3 (*) 2.0 - 4.0 g/dL    A-G Ratio 0.9  0.8 - 1.7     LIPASE    Collection Time     10/16/12  2:39 AM       Result Value Range    Lipase 91  73 - 393 U/L   LACTIC ACID, PLASMA    Collection Time     10/16/12  6:35 AM       Result Value Range    Lactic acid 1.4  0.4 - 2.0 MMOL/L     CT films reviewed by me.      Signed By: Zettie Cooley, MD, Georgina Pillion, CPI    October 16, 2012, 8:46 AM     [facility]  [email or web address]  [phone]

## 2012-10-16 NOTE — ED Provider Notes (Signed)
HPI Comments: 52 yo F with hx of HTN, GERD, gonorrhea, and arthritis presents to the ED c/o ABD cramping constantly since 1800 yesterday.  Pt also states that she has had diarrhea with rectal bleeding with few clots.  Pt states that she has also had nausea.  Pt is on Bayer.  Pt denies smoking, ETOH, and drug use.  Pt's PCP is Dr. Gayla Doss.  Pt denies fever, chills, vomiting, vaginal discharge or bleeding, recent travel, and any other sx or complaints.       Past Medical History   Diagnosis Date   ??? Hypertension    ??? Gonorrhea    ??? Arthritis    ??? GERD (gastroesophageal reflux disease)         History reviewed. No pertinent past surgical history.      History reviewed. No pertinent family history.     History     Social History   ??? Marital Status: LEGALLY SEPARATED     Spouse Name: N/A     Number of Children: N/A   ??? Years of Education: N/A     Occupational History   ??? Not on file.     Social History Main Topics   ??? Smoking status: Never Smoker    ??? Smokeless tobacco: Not on file   ??? Alcohol Use: No   ??? Drug Use: No   ??? Sexually Active: Yes     Other Topics Concern   ??? Not on file     Social History Narrative   ??? No narrative on file                  ALLERGIES: Review of patient's allergies indicates no known allergies.      Review of Systems   Constitutional: Negative.  Negative for fever, chills and diaphoresis.   HENT: Negative.  Negative for congestion, sore throat, trouble swallowing, neck pain and neck stiffness.    Eyes: Negative.  Negative for photophobia, pain and redness.   Respiratory: Negative.  Negative for cough, chest tightness, shortness of breath and wheezing.    Cardiovascular: Negative.  Negative for chest pain and palpitations.   Gastrointestinal: Positive for nausea, abdominal pain, diarrhea and blood in stool. Negative for vomiting.   Genitourinary: Negative for dysuria, frequency and difficulty urinating.   Musculoskeletal: Negative.  Negative for myalgias and arthralgias.   Skin: Negative.     Neurological: Negative.  Negative for dizziness, tremors, seizures, syncope, speech difficulty, light-headedness and headaches.   Psychiatric/Behavioral: Negative.  Negative for confusion. The patient is not nervous/anxious.    All other systems reviewed and are negative.        Filed Vitals:    10/16/12 0330 10/16/12 0345 10/16/12 0400 10/16/12 0415   BP: 159/92  166/91    Pulse: 82 81 84 84   Temp:       Resp: 17 16 16 16    Height:       Weight:       SpO2: 93% 95% 97% 96%            Physical Exam   Nursing note and vitals reviewed.  Constitutional: She is oriented to person, place, and time. She appears well-developed and well-nourished. No distress.   HENT:   Head: Normocephalic and atraumatic.   Eyes: Conjunctivae and EOM are normal. Pupils are equal, round, and reactive to light. Right eye exhibits no discharge. Left eye exhibits no discharge. No scleral icterus.   Neck: Normal range of motion.  Neck supple. No JVD present. No tracheal deviation present. No thyromegaly present.   Cardiovascular: Normal rate, regular rhythm and normal heart sounds.  Exam reveals no gallop and no friction rub.    No murmur heard.  Pulmonary/Chest: Effort normal and breath sounds normal. No stridor. No respiratory distress. She has no wheezes. She has no rales. She exhibits no tenderness.   Abdominal: Soft. Bowel sounds are normal. She exhibits no distension and no mass. There is tenderness. There is no rebound and no guarding.   Diffuse    Genitourinary: Guaiac positive stool.   Musculoskeletal: Normal range of motion. She exhibits no edema and no tenderness.   Lymphadenopathy:     She has no cervical adenopathy.   Neurological: She is alert and oriented to person, place, and time. No cranial nerve deficit. Coordination normal.   Skin: Skin is warm and dry. No rash noted. She is not diaphoretic. No erythema. No pallor.   Psychiatric: She has a normal mood and affect. Her behavior is normal. Judgment and thought content normal.         MDM     Differential Diagnosis; Clinical Impression; Plan:     6:31 AM  Discussed with gi, wants antibiotics, cultures and admit for colonoscopy.  Discussed with hospital doctor       Procedures    -------------------------------------------------------------------------------------------------------------------     EKG INTERPRETATIONS:  None      LAB RESULTS:   Recent Results (from the past 8 hour(s))   CBC WITH AUTOMATED DIFF    Collection Time     10/16/12  2:39 AM       Result Value Range    WBC 13.9 (*) 4.6 - 13.2 K/uL    RBC 4.51  4.20 - 5.30 M/uL    HGB 12.7  12.0 - 16.0 g/dL    HCT 16.1  09.6 - 04.5 %    MCV 80.5  74.0 - 97.0 FL    MCH 28.2  24.0 - 34.0 PG    MCHC 35.0  31.0 - 37.0 g/dL    RDW 40.9  81.1 - 91.4 %    PLATELET 323  135 - 420 K/uL    MPV 9.5  9.2 - 11.8 FL    NEUTROPHILS 89 (*) 40 - 73 %    LYMPHOCYTES 8 (*) 21 - 52 %    MONOCYTES 3  3 - 10 %    EOSINOPHILS 0  0 - 5 %    BASOPHILS 0  0 - 2 %    ABS. NEUTROPHILS 12.4 (*) 1.8 - 8.0 K/UL    ABS. LYMPHOCYTES 1.1  0.9 - 3.6 K/UL    ABS. MONOCYTES 0.4  0.05 - 1.2 K/UL    ABS. EOSINOPHILS 0.0  0.0 - 0.4 K/UL    ABS. BASOPHILS 0.0  0.0 - 0.06 K/UL    DF AUTOMATED     METABOLIC PANEL, COMPREHENSIVE    Collection Time     10/16/12  2:39 AM       Result Value Range    Sodium 139  136 - 145 mmol/L    Potassium 3.9  3.5 - 5.5 mmol/L    Chloride 106  100 - 108 mmol/L    CO2 21  21 - 32 mmol/L    Anion gap 12  3.0 - 18 mmol/L    Glucose 137 (*) 74 - 99 mg/dL    BUN 16  7.0 - 18 MG/DL    Creatinine 7.82  0.6 - 1.3 MG/DL    BUN/Creatinine ratio 17  12 - 20      GFR est AA >60  >60 ml/min/1.56m2    GFR est non-AA >60  >60 ml/min/1.61m2    Calcium 9.9  8.5 - 10.1 MG/DL    Bilirubin, total 0.3  0.2 - 1.0 MG/DL    ALT 48  13 - 56 U/L    AST 51 (*) 15 - 37 U/L    Alk. phosphatase 102  45 - 117 U/L    Protein, total 8.3 (*) 6.4 - 8.2 g/dL    Albumin 4.0  3.4 - 5.0 g/dL    Globulin 4.3 (*) 2.0 - 4.0 g/dL    A-G Ratio 0.9  0.8 - 1.7     LIPASE    Collection Time      10/16/12  2:39 AM       Result Value Range    Lipase 91  73 - 393 U/L       RADIOLOGY RESULTS:  CT ABD PELV W CONT    (Results Pending)  5:42 AM  1. Dependent atelectasis or pulmonary scarring. 2. 2.3x2.7 rounded hypodensity in the posterior right hepatic lobe has lobulated borders and a focal area of enhancement, suggestive of a hemangioma. 3. Mild fat stranding and paracolic gutter edema adjacent to the descending colon. This segment also demonstrates mural thickening that continues to the level of the sigmoid colon. DDx includes colitis, infectious or inflammatory, ischemic is felt less likely as the IMA is well enhanced. Neoplasm is potentially possible but low on the ddx currently. 4. Scattered calcified and non-calcified aortic atheromatous disease. 5. Dense thick linear objects in the ascending colon and rectum, possibly ingested material. 6. Minimal DDD. Severe lower lumbar fact arthropathy. 7. No bowel obstruction. Normal appendix.            ORDERS:  Orders Placed This Encounter   ??? CT ABD PELV W CONT     Standing Status: Standing      Number of Occurrences: 1      Standing Expiration Date:      Order Specific Question:  Transport     Answer:  Doctor, general practice [5]     Order Specific Question:  Reason for Exam     Answer:  abd pain , rectal bleeding     Order Specific Question:  Is Patient Allergic to Contrast Dye?     Answer:  No   ??? CBC WITH AUTOMATED DIFF     Standing Status: Standing      Number of Occurrences: 1      Standing Expiration Date:    ??? METABOLIC PANEL, COMPREHENSIVE     Standing Status: Standing      Number of Occurrences: 1      Standing Expiration Date:    ??? LIPASE     Standing Status: Standing      Number of Occurrences: 1      Standing Expiration Date:    ??? morphine injection 4 mg     Sig:    ??? ioversol (OPTIRAY) 320 mg iodine/mL contrast injection 1-125 mL     Sig:        CONSULTATIONS:  6:12 AM:  Dr. Jerre Simon, MD discussed patient with Dr. Adelene Idler, GI. Standard discussion; including  history of patient???s chief complaint, available diagnostic results, and treatment course.        PROGRESS NOTES:  2:16 AM:  Dr. Jerre Simon, MD answered the  patient's questions regarding treatment.  6:03 AM: Pt states that she feels a little better currently in the ED.      ED DIAGNOSIS AND DISPOSITION:  Diagnosis: ischemic colitis     Disposition: admit    Follow-up Information    None          Patient's Medications   Start Taking    No medications on file   Continue Taking    FAMOTIDINE (PEPCID) 20 MG TABLET    Take 20 mg by mouth two (2) times a day.    Indications: GASTROESOPHAGEAL REFLUX    FOLIC ACID (FOLVITE) 1 MG TABLET    Take  by mouth daily.    Indications: supplement    LISINOPRIL-HYDROCHLOROTHIAZIDE (PRINZIDE, ZESTORETIC) 20-12.5 MG PER TABLET    Take  by mouth daily.    Indications: HYPERTENSION    METHOTREXATE (RHEUMATREX) 2.5 MG TABLET    Take 2.5 mg by mouth.      OMEGA-3 FATTY ACIDS-VITAMIN E (FISH OIL) 1,000 MG CAP    Take 1 Cap by mouth.      TRAMADOL (ULTRAM) 50 MG TABLET    Take 50 mg by mouth every six (6) hours as needed.    Indications: PAIN   These Medications have changed    No medications on file   Stop Taking    No medications on file       Scribe Attestation:  written ZO:XWRUEAV Horton, (2:24 AM) scribing for and in the presence of Dr.Nicole Defino Sheran Luz, MD  ED Provider (2:24 AM).    Provider Attestation:   I personally performed the services described in the documentation, reviewed the documentation, as recorded by the scribe in my presence, and it accurately and completely records my words and actions.   Dr. Jerre Simon, MD ED Provider   Provider Attestation:   I personally performed the services described in the documentation, reviewed the documentation, as recorded by the scribe in my presence, and it accurately and completely records my words and actions. October 16, 2012 at 6:32 AM - Jerre Simon, MD         -------------------------------------------------------------------------------------------------------------------

## 2012-10-16 NOTE — ED Notes (Signed)
Patient with c/o abdominal cramping 10/10 to lower abdomen with vomiting, diarrhea, and rectal bleeding with clots worsening since 1800.

## 2012-10-16 NOTE — Progress Notes (Signed)
[facility]   Discharge Planning/Social Services Assessment    Reasons for Intervention: Chart reviewed.  Met with pt., verified all demographics.  States has no ins, but has applied for MCD.  States sees Wille Celeste, NP for medical follow up.  Uses no DME.  Independent with ADL's prior to admit.  Please try to utilize $4 [facility] drug list @ discharge, if possible, as pt has no ins, thanks.  Anticipate no needs @ discharge.  PLAN: home when medically stable.  Will cont to assess & be available for any needs.  Pat Rizzo,RN, ext [phone].    High Risk Criteria  []  Yes  [x] No   Physician Referral  []  Yes  [x] No        Date    Nursing Referral  []  Yes  [x] No        Date    Patient/Family Request  []  Yes  [x] No        Date       Resources:    Medicare  []  Yes  [x] No   Medicaid  []  Yes  [x] No   No Resources  [x]  Yes  [] No   Private Insurance  []  Yes  [x] No   Case Manager Name/Phone Number    Other  []  Yes  [x] No        (i.e. Workman's Comp)         Prior Services:    Prior Services  []  Yes  [x] No   Home Health  []  Yes  [x] No   Agency    Private Home Care  []  Yes  [x] No        Number of Hours    Home Care Program  []  Yes  [x] No   Case Manager    Meals on Wheels  []  Yes  [x] No   Office on Aging  []  Yes  [x] No   Transportation Services  []  Yes  [x] No   Nursing Home  []  Yes  [x] No        Nursing Home Name    Rehab/VA Hospital  []  Yes  [x] No        Rehab/VA Name    Other       Information Source:      Information obtained from  [x]  Patient  []  Parent   []  Guardian  []  Child  []  Spouse   []  Significant Other/Partner   []  Friend      []  EMS    []  Nursing Home Chart          []  Other:   Chart Review  [x]  Yes  [] No     Family/Support System:    Patient lives with  []  Alone    []  Spouse   []  Significant Other  [x]  Children  []  Caretaker   []  Parent  []  Sibling     []  Other       Other Support System:    Is the patient responsible for care of others  [x]  Yes  [] No   Information of person caring for patient on  discharge     Managers financial affairs independently  [x]  Yes  [] No   If no, explain:      Status Prior to Admission:    Mental Status  [x]  Awake  [x]  Alert  [x]  Oriented  [x]  Quiet/Calm []  Lethargic/Sedated   []  Disoriented  []  Restless/Anxious  []  Combative   Personal Care  []  Dependent  [x]  Independent Personal Care  []  Requires Assistance  Meal Preparation Ability  $Remove'[x]'zlNEWvm$  Independent   '[]'$  Standby Assistance   '[]'$  Minimal Assistance   '[]'$  Moderate Assistance  $RemoveBef'[]'ZZMcdXcnjR$  Maximum Assistance     '[]'$  Total Assistance   Chores  $Remov'[x]'HlWIZC$  Independent with Chores   '[]'$  N/A Nursing Home Resident   '[]'$  Requires Assistance   Bowel/Bladder  $RemoveBefore'[x]'MFTWlAAMZUCuC$  Continent  $RemoveBe'[]'RqEQlQwwh$  Catheter  $RemoveB'[]'teVYtzLl$  Incontinent  $RemoveBefo'[]'ibXUqHrwBAV$  Ostomy Self-Care    '[]'$  Urine Diversion Self-Care  $RemoveBe'[]'IkGKyXptg$  Maximum Assistance     '[]'$  Total Assistance   Number of Persons needed for assistance    DME at home  $Rem'[]'psHJ$  Cane, Quad  $Rem'[]'kahF$  [place], Straight   '[]'$  Commode    '[]'$  Bathroom/Grab Bars  $Rem'[]'DfVh$  Hospital Bed  $Re'[]'TJV$  Nebulizer  $RemoveBe'[]'VaFzIZesm$  Oxygen           '[]'$  Raised Toilet Seat  $Rem'[]'hmRG$  Shower Chair  $Remo'[]'XWcuZ$  Side Rails for Bed   '[]'$  Tub Transfer Bench   '[]'$  Walker, Rolling  $Remove'[]'hAGHehC$  Walker, Standard      '[]'$  Other:   Vendor      Treatment Presently Receiving:    Current Treatments  $RemoveBef'[]'cFuGrxKxfw$  Chemotherapy  $RemoveBefor'[]'QpmLnerEAxaR$  Dialysis  $RemoveB'[]'ZlwHSCpV$  Insulin  $Remove'[]'eZDRBIa$  IVAB $Rem'[x]'ogyQ$  IVF   '[]'$  O2  $R'[]'gy$  PCA   '[]'$  PT   '[]'$  RT   '[]'$  Tube Feedings   '[]'$  Wound Care     Psychosocial Evaluation:    Verbalized Knowledge of Disease Process  $Remove'[x]'XKJlupH$  Patient  $Remove'[]'opxntgs$ Family   Coping with Disease Process  $Remove'[x]'iXMLjWP$  Patient  $Remove'[]'virSlPw$ Family   Requires Further Counseling Coping with Disease Process  $Remove'[]'OaTtjFF$  Patient  $Remove'[]'NfkgOyS$ Family     Identified Projected Needs:    Home Health Aid  $Re'[]'uuA$  Yes  $Re'[x]'oFO$ No   Transportation  $RemoveBeforeD'[]'nLNzONDlYbLfsA$  Yes  $Re'[x]'Umr$ No   Education  $RemoveBe'[]'LsJKiSPSA$  Yes  $Re'[x]'imc$ No        Specific Education     Financial Counseling  $RemoveBef'[]'SEzpKMtOsr$  Yes  $Re'[x]'lOp$ No   Inability to Care for Self/Will Require 24 hour care  $Rem'[]'mFyR$  Yes  $Re'[x]'oda$ No   Pain Management  $RemoveBef'[]'ZCjncudlsB$  Yes  $Re'[x]'shw$ No   Home Infusion Therapy  $Remove'[]'UzAfQkI$  Yes  $Re'[x]'OUq$ No   Oxygen Therapy  $Remove'[]'olxLTNY$  Yes  $Re'[x]'hAI$ No   DME  $Re'[]'iWv$  Yes  $Re'[x]'Zcl$ No   Long Term Care Placement  $RemoveBe'[]'OVpvZnZXj$  Yes  $Re'[x]'uEa$ No   Rehab  $Remo'[]'WwcRW$  Yes  $Re'[x]'GAZ$ No    Physical Therapy  $Remove'[]'zSzeVdx$  Yes  $Re'[x]'kIF$ No   Needs Anticipated At This Time  $Rem'[]'vkFV$  Yes  $Re'[x]'gPb$ No     Intra-Hospital Referral:    Home Health Liasion  $Remove'[]'NGjccNV$  Yes  $Re'[x]'vTy$ No   Life Coach  $Remo'[]'XftHY$  Yes  $Re'[x]'yDm$ No   Patient Representative  $RemoveBeforeD'[]'xguhcfCqiLWPbt$  Yes  $Re'[x]'uNa$ No   Staff for Teaching Needs  $Remo'[]'wJeav$  Yes  $Re'[x]'moa$ No   Specialty Teaching Needs     Diabetic Educator  $RemoveB'[]'RpxUVxFr$  Yes  $Re'[x]'EHK$ No   Referral for Diabetic Educator Needed  $Remov'[]'MgkcdP$  Yes  $Re'[x]'BCa$ No  If Yes, place order for Nutritionist or Diabetic Consult     Tentative Discharge Plan:    Home with No Services  $RemoveB'[x]'sqcnhXBb$  Yes  $Re'[]'Xfj$ No   Home with Home Health Follow-up  $RemoveBe'[]'CCALXNzCU$  Yes  $Re'[x]'XCd$ No        If Yes, specify type    Home Care Program  $Remove'[]'rXojokv$  Yes  $Re'[x]'xUu$ No        If Yes, specify type    Meals on Wheels  $Remov'[]'WDXTTy$  Yes  $Re'[x]'lsz$ No   Office of Aging  $Remo'[]'lgbNw$  Yes  $Re'[x]'iBW$ No   NHP  $Re'[]'AVL$  Yes  $Re'[x]'NfD$ No   Return  to the Nursing Home  []  Yes  [x] No   Rehab Therapy  []  Yes  [x] No   Acute Rehab  []  Yes  [x] No   Subacute Rehab  []  Yes  [x] No   Private Care  []  Yes  [x] No   Substance Abuse Referral  []  Yes  [x] No   Transportation  []  Yes  [x] No   Chore Service  []  Yes  [x] No   Inpatient Hospice  []  Yes  [x] No   OP RT  []  Yes  [x]  No   OP Hemo  []  Yes  [x]  No   OP PT  []  Yes  [x] No   Support Group  []  Yes  [x] No   Reach to Recovery  []  Yes  [x] No   OP Oncology Clinic  []  Yes  [x] No   Clinic Appointment  []  Yes  [x] No   DME  []  Yes  [x] No   Comments    Name of D/C Planner or Social Worker Given to Patient or Family Minette Headland   Phone Number Pager: [phone]        Extension Ext. [phone], VM [phone]    Date 10-16-2012   Time 1400

## 2012-10-16 NOTE — Progress Notes (Signed)
Chaplain conducted an initial consultation and Spiritual Assessment forJosephine A Lee, who is a 52 y.o.,female. Patient???s Primary Language is: [place].   According to the patient???s EMR Religious Affiliation is: Baptist.     The reason the Patient came to the hospital is:   Patient Active Problem List    Diagnosis Date Noted   ??? Colitis 10/16/2012   ??? Screening for colon cancer 12/18/2010        The Chaplain provided the following Interventions:  Initiated a relationship of care and support.   Explored issues of faith, belief, spirituality and religious/ritual needs while hospitalized.  Listened empathically.  Provided chaplaincy education.  Provided information about Spiritual Care Services.  Offered prayer and assurance of continued prayers on patients behalf.   Chart reviewed.    The following outcomes were achieved:  Patient shared limited information about both their medical narrative and spiritual journey/beliefs.  Patient expressed gratitude for pastoral care visit.    Assessment:  Patient does not have any religious/cultural needs that will affect patient???s preferences in health care.  Patient did not indicate any spiritual or religious issues which require Spiritual Care Services interventions at this time.     Plan:  Chaplains will continue to follow and will provide pastoral care on an as needed/requested basis.  Chaplain recommends bedside caregivers page chaplain on duty if patient shows signs of acute spiritual or emotional distress.      Chaplain Briscoe Deutscher, MDiv,   Board Certified Chaplain  [phone] - Office

## 2012-10-17 LAB — CBC WITH AUTOMATED DIFF
ABS. BASOPHILS: 0 10*3/uL (ref 0.0–0.06)
ABS. EOSINOPHILS: 0.2 10*3/uL (ref 0.0–0.4)
ABS. LYMPHOCYTES: 2.6 10*3/uL (ref 0.9–3.6)
ABS. MONOCYTES: 1 10*3/uL (ref 0.05–1.2)
ABS. NEUTROPHILS: 8.3 10*3/uL — ABNORMAL HIGH (ref 1.8–8.0)
BASOPHILS: 0 % (ref 0–2)
EOSINOPHILS: 2 % (ref 0–5)
HCT: 33.8 % — ABNORMAL LOW (ref 35.0–45.0)
HGB: 11.7 g/dL — ABNORMAL LOW (ref 12.0–16.0)
LYMPHOCYTES: 22 % (ref 21–52)
MCH: 28.3 PG (ref 24.0–34.0)
MCHC: 34.6 g/dL (ref 31.0–37.0)
MCV: 81.6 FL (ref 74.0–97.0)
MONOCYTES: 8 % (ref 3–10)
MPV: 9.7 FL (ref 9.2–11.8)
NEUTROPHILS: 68 % (ref 40–73)
PLATELET: 261 10*3/uL (ref 135–420)
RBC: 4.14 M/uL — ABNORMAL LOW (ref 4.20–5.30)
RDW: 13.6 % (ref 11.6–14.5)
WBC: 12.1 10*3/uL (ref 4.6–13.2)

## 2012-10-17 LAB — HEPATIC FUNCTION PANEL
A-G Ratio: 0.9 (ref 0.8–1.7)
ALT (SGPT): 40 U/L (ref 13–56)
AST (SGOT): 28 U/L (ref 15–37)
Albumin: 3.3 g/dL — ABNORMAL LOW (ref 3.4–5.0)
Alk. phosphatase: 76 U/L (ref 45–117)
Bilirubin, direct: 0.1 MG/DL (ref 0.0–0.2)
Bilirubin, total: 0.4 MG/DL (ref 0.2–1.0)
Globulin: 3.6 g/dL (ref 2.0–4.0)
Protein, total: 6.9 g/dL (ref 6.4–8.2)

## 2012-10-17 LAB — METABOLIC PANEL, BASIC
Anion gap: 6 mmol/L (ref 3.0–18)
BUN/Creatinine ratio: 13 (ref 12–20)
BUN: 14 MG/DL (ref 7.0–18)
CO2: 29 mmol/L (ref 21–32)
Calcium: 9.2 MG/DL (ref 8.5–10.1)
Chloride: 104 mmol/L (ref 100–108)
Creatinine: 1.07 MG/DL (ref 0.6–1.3)
GFR est AA: >60 mL/min/{1.73_m2} (ref >60)
GFR est non-AA: 57 mL/min/{1.73_m2} — ABNORMAL LOW (ref >60)
Glucose: 98 mg/dL (ref 74–99)
Potassium: 4.4 mmol/L (ref 3.5–5.5)
Sodium: 139 mmol/L (ref 136–145)

## 2012-10-17 LAB — HGB & HCT
HCT: 34.3 % — ABNORMAL LOW (ref 35.0–45.0)
HGB: 11.9 g/dL — ABNORMAL LOW (ref 12.0–16.0)

## 2012-10-17 LAB — LACTIC ACID: Lactic acid: 0.8 MMOL/L (ref 0.4–2.0)

## 2012-10-17 MED ADMIN — pneumococcal 23-valent (PNEUMOVAX 23) injection 0.5 mL: INTRAMUSCULAR | @ 17:00:00 | NDC 00006483701

## 2012-10-17 MED ADMIN — morphine injection 4 mg: INTRAVENOUS | @ 14:00:00 | NDC 00409189001

## 2012-10-17 MED ADMIN — dicyclomine (BENTYL) capsule 10 mg: ORAL | @ 17:00:00 | NDC 51079011801

## 2012-10-17 MED ADMIN — dicyclomine (BENTYL) capsule 10 mg: ORAL | @ 14:00:00 | NDC 51079011801

## 2012-10-17 MED ADMIN — morphine injection 4 mg: INTRAVENOUS | @ 02:00:00 | NDC 00409189001

## 2012-10-17 MED ADMIN — influenza vaccine 2014-15(5yr+)(PF) (AFLURIA) injection 0.5 mL: INTRAMUSCULAR | @ 17:00:00 | NDC 33332001402

## 2012-10-17 MED ADMIN — dicyclomine (BENTYL) capsule 10 mg: ORAL | @ 02:00:00 | NDC 51079011801

## 2012-10-17 MED ADMIN — PHARMACY INFORMATION NOTE - Methotrexate Clarification Needed (ACTION REQUIRED): @ 13:00:00 | NDC 00740100004

## 2012-10-17 MED ADMIN — 0.45% sodium chloride infusion: INTRAVENOUS | @ 10:00:00 | NDC 00409798509

## 2012-10-17 NOTE — Progress Notes (Signed)
Received patient in bed resting quietly, easily awakened. Pt with no s/s acute distress. Abdominal tenderness noted. Call bell within reach. Bed in lowest position. Will monitor.

## 2012-10-17 NOTE — Discharge Summary (Signed)
Discharge Summary    Patient: Nichole Lee               Sex: female          DOA: 10/16/2012         Date of Birth:  11-07-1960      Age:  52 y.o.        LOS:  LOS: 1 day                Admit Date: 10/16/2012    Discharge Date: 10/17/2012    Discharge Medications:     Current Discharge Medication List      START taking these medications    Details   dicyclomine (BENTYL) 10 mg capsule Take 1 capsule by mouth four (4) times daily.  Qty: 120 capsule, Refills: 0         CONTINUE these medications which have NOT CHANGED    Details   folic acid (FOLVITE) 1 mg tablet Take  by mouth daily.      omeprazole (PRILOSEC) 20 mg capsule Take 20 mg by mouth daily.      omega-3 fatty acids-vitamin e (FISH OIL) 1,000 mg cap Take 1 capsule by mouth.      hydroxychloroquine (PLAQUENIL) 200 mg tablet Take 200 mg by mouth daily.      Hydrochlorothiazide (HYDRODIURIL) 12.5 mg tablet Take 12.5 mg by mouth daily.      isoniazid (NYDRAZID) 100 mg tablet Take 100 mg by mouth daily.      methotrexate (RHEUMATREX) 2.5 mg tablet Take 2.5 mg by mouth.           STOP taking these medications       famotidine (PEPCID) 20 mg tablet Comments:   Reason for Stopping:         traMADol (ULTRAM) 50 mg tablet Comments:   Reason for Stopping:         lisinopril-hydrochlorothiazide (PRINZIDE, ZESTORETIC) 20-12.5 mg per tablet Comments:   Reason for Stopping:               Follow-up:   GI  Zettie Cooley, MD  1-2 months as needed   [address]  [facility] [address]  [phone]        Discharge Condition: Stable    Activity: Activity as tolerated    Diet: Regular Diet    Labs:  Labs: Results:       Chemistry Recent Labs      10/17/12   0620  10/16/12   0239   GLU  98  137*   NA  139  139   K  4.4  3.9   CL  104  106   CO2  29  21   BUN  14  16   CREA  1.07  0.95   CA  9.2  9.9   AGAP  6  12   BUCR  13  17   AP  76  102   TP  6.9  8.3*   ALB  3.3*  4.0   GLOB  3.6  4.3*   AGRAT  0.9  0.9      CBC w/Diff  Recent Labs      10/17/12   0620  10/16/12   2150  10/16/12   1600   10/16/12   0239   WBC  12.1   --    --    --   13.9*  RBC  4.14*   --    --    --   4.51   HGB  11.7*  11.9*  12.2   < >  12.7   HCT  33.8*  34.3*  34.9*   < >  36.3   PLT  261   --    --    --   323   GRANS  68   --    --    --   89*   LYMPH  22   --    --    --   8*   EOS  2   --    --    --   0    < > = values in this interval not displayed.      Cardiac Enzymes No results found for this basename: CPK, CKRMB, CKND1, TROIP, MYO,  in the last 72 hours   Coagulation No results found for this basename: PTP, INR, APTT,  in the last 72 hours    Lipid Panel Lab Results   Component Value Date/Time    Cholesterol, total 232 08/01/2011 11:55 AM    HDL Cholesterol 51 08/01/2011 11:55 AM    LDL, calculated 147.6 08/01/2011 11:55 AM    VLDL, calculated 33.4 08/01/2011 11:55 AM    Triglyceride 167 08/01/2011 11:55 AM    CHOL/HDL Ratio 4.5 08/01/2011 11:55 AM      BNP No results found for this basename: BNPP,  in the last 72 hours   Liver Enzymes Recent Labs      10/17/12   0620   TP  6.9   ALB  3.3*   AP  76   SGOT  28      Thyroid Studies Lab Results   Component Value Date/Time    TSH 0.83 08/01/2011 11:55 AM          Imaging:  CT Abd pelvis 10/16/12  IMPRESSION:   1. Long segment wall thickening and mural edema with adjacent mesenteric   stranding involving the descending colon from the sigmoid flexure to the   proximal sigmoid compatible with colitis. Differential includes infectious and   inflammatory etiologies. Ischemic bowel disease or possible neoplasm is   considered less likely.   2. Findings compatible with a cavernous hemangioma.   3. Severe facet disease at L5-S1 with minimal grade 1 anterolisthesis.      Consults: Gastroenterology    Treatment Team: Treatment Team: Attending Provider: Carollee Leitz, MD; Physician: Raynelle Jan, MD; Consulting Provider: Zettie Cooley, MD    Hospital Course: Pt with lower abd pain (severe, in waves) yesterday at 6 PM  associated with 3 episodes of bloody loose stools, with few clots. She felt nauseous no vomiting but no fever or chills. She does not smoke or drink. No drug ir decongestant use. No recent travel. No h/o similar episodes before. Colon in 12/2010 by Dr. Frederik Pear was negative.    Major issues addressed during hospitalization outlined  below.   1. Mild ischemic colitis. Last colon in 12/2010 - tics.   2. HTN.   3. H/O Migraines.   4. GERD        Advance diet, stop antibiotics and PPI. Home when able to tolerate diet and pain is manageable. I expect her to have abdominal cramping for the next few weeks, slowly will diminish. Abx not needed for this. Colonoscopy not indicated.      Kristie Cowman, MD  October 17, 2012        Total time spent 

## 2012-10-17 NOTE — Progress Notes (Signed)
Attention Nursing / Physicians:    The following INCOMPLETE medication order cannot be filled by the Pharmacy:    - Methotrexate (Rheumatrex) 2.5mg  tablet PO - FREQUENCY is missing    Please enter a NEW Physician Order with corrections / completions made.    Thank you.

## 2012-10-17 NOTE — Other (Signed)
Bedside and Verbal shift change report given to Brittany Smith, RN (oncoming nurse) by BRENDA  JONES, RN (offgoing nurse).  Report given with SBAR, Kardex, Intake/Output, MAR and Recent Results.

## 2012-10-17 NOTE — Other (Signed)
Patient discharge home, accompanied by CNA via wheelchair to front entrance where car is waiting.  Patient has all discharge instructions/belongings and Prescriptions. No distress noted.

## 2012-10-22 LAB — CULTURE, BLOOD
Culture result:: NO GROWTH
Culture result:: NO GROWTH

## 2012-10-26 NOTE — Discharge Instructions (Signed)
RRAT score 22. Patient's first admission.

## 2013-02-22 MED ORDER — HYDROCODONE-ACETAMINOPHEN 7.5 MG-325 MG TAB
ORAL | Status: AC
Start: 2013-02-22 — End: 2013-02-22
  Administered 2013-02-22: via ORAL

## 2013-02-22 NOTE — ED Notes (Signed)
PT. Medicated as ordered, resting in bed at this time awaiting CT , pt. Stable will continue to monitor.

## 2013-02-22 NOTE — ED Provider Notes (Addendum)
HPI Comments: Nichole Lee is a 53 y.o. female with a History of chronic back pain HTN, GERD and arthritis who presents to the emergency department c/o lower back pain onset today. Pt states the pain she is experiencing today is different than her normal bulging disc pain. Pt states pain radiates from bilateral buttocks up paraspinal lumbar muscles. Pt denies heavy lifting or injury. Pt denies fever, chills, SOB, chest pain, abdominal pain, NVD, urinary symptoms, urinary/bowel dysfunction, headache and dizziness. Pt expresses no other complaints at this time.     The history is provided by the patient.        Past Medical History   Diagnosis Date   ??? Hypertension    ??? Gonorrhea    ??? Arthritis    ??? GERD (gastroesophageal reflux disease)         History reviewed. No pertinent past surgical history.      History reviewed. No pertinent family history.     History     Social History   ??? Marital Status: LEGALLY SEPARATED     Spouse Name: N/A     Number of Children: N/A   ??? Years of Education: N/A     Occupational History   ??? Not on file.     Social History Main Topics   ??? Smoking status: Never Smoker    ??? Smokeless tobacco: Not on file   ??? Alcohol Use: No   ??? Drug Use: No   ??? Sexual Activity: Yes     Other Topics Concern   ??? Not on file     Social History Narrative                  ALLERGIES: Review of patient's allergies indicates no known allergies.      Review of Systems   Constitutional: Negative for fever and chills.   HENT: Negative for congestion and sore throat.    Eyes: Negative for visual disturbance.   Respiratory: Negative for shortness of breath.    Cardiovascular: Negative for chest pain.   Gastrointestinal: Negative for nausea, vomiting, abdominal pain, diarrhea and constipation.   Genitourinary: Negative for dysuria, urgency, frequency, decreased urine volume and difficulty urinating.   Musculoskeletal: Positive for back pain.   Skin: Negative for rash.   Neurological: Negative for dizziness and  headaches.   Psychiatric/Behavioral: Negative for dysphoric mood.   All other systems reviewed and are negative.      Filed Vitals:    02/22/13 1847   BP: 130/80   Pulse: 89   Temp: 98.3 ??F (36.8 ??C)   Resp: 14   Height: 5\' 2"  (1.575 m)   Weight: 64.411 kg (142 lb)   SpO2: 100%            Physical Exam   Constitutional: She is oriented to person, place, and time. She appears well-developed and well-nourished. No distress.   HENT:   Head: Normocephalic and atraumatic.   Mouth/Throat: Oropharynx is clear and moist.   Eyes: Conjunctivae are normal. Pupils are equal, round, and reactive to light. No scleral icterus.   Neck: Normal range of motion. Neck supple. No tracheal deviation present.   Cardiovascular: Normal rate, normal heart sounds and intact distal pulses.    Pulmonary/Chest: Effort normal and breath sounds normal. No respiratory distress. She has no wheezes.   Abdominal: Soft. Bowel sounds are normal. She exhibits no distension. There is no tenderness.   Musculoskeletal: Normal range of motion. She exhibits tenderness. She  exhibits no edema.   Tenderness to bilateral paraspinal lumbar. Negative straight leg raise.   Lymphadenopathy:     She has no cervical adenopathy.   Neurological: She is alert and oriented to person, place, and time. She has normal strength and normal reflexes. She displays normal reflexes. No cranial nerve deficit or sensory deficit.   Normal sensation to pinprick.   Skin: Skin is warm and dry. She is not diaphoretic.   Psychiatric: She has a normal mood and affect.   Nursing note and vitals reviewed.       MDM  Number of Diagnoses or Management Options  Chronic low back pain:      Amount and/or Complexity of Data Reviewed  Clinical lab tests: ordered and reviewed  Tests in the radiology section of CPT??: ordered and reviewed        Procedures    -------------------------------------------------------------------------------------------------------------------  PROGRESS NOTES:  6:45 PM:  Amalia Greenhouse, MD is evaluating the patient. Discussed treatment plan with the patient.  8:46 PM  Amalia Greenhouse, MD reassessed the patient. Patient states she feels better. Reviewed lab and CT results with the patient. Gave patient a copy of CT results. Discussed diagnosis, prescriptions and follow up instructions with the patient. Answered patient's questions. Patient is stable and ready for discharge.       CONSULTATIONS:  None    ORDERS:  Orders Placed This Encounter   ??? CT SPINE LUMB WO CONT   ??? CBC WITH AUTOMATED DIFF   ??? METABOLIC PANEL, BASIC   ??? URINALYSIS W/ RFLX MICROSCOPIC   ??? HYDROcodone-acetaminophen (NORCO) 7.5-325 mg per tablet 1 Tab       MEDICATIONS:  Medications   HYDROcodone-acetaminophen (NORCO) 7.5-325 mg per tablet 1 Tab (1 Tab Oral Given 02/22/13 1852)        RADIOLOGY RESULTS:  CT SPINE LUMB WO CONT   Findings: 1. No evidence for acute fracture or malalignment. 2. Advanced facet arthropathy is present at L5-S1, similar to the prior CT abdomen/pelvis, with likely at least moderate neural foraminal narrowing at this level, right greater than left. If clinical symptoms suggest possible nerve root impingement, consider MRI lumbar spine for further evaluation.          LAB & EKG RESULTS:   Recent Results (from the past 12 hour(s))   CBC WITH AUTOMATED DIFF    Collection Time     02/22/13  7:02 PM       Result Value Ref Range    WBC 6.9  4.6 - 13.2 K/uL    RBC 4.07 (*) 4.20 - 5.30 M/uL    HGB 11.6 (*) 12.0 - 16.0 g/dL    HCT 16.1 (*) 09.6 - 45.0 %    MCV 84.0  74.0 - 97.0 FL    MCH 28.5  24.0 - 34.0 PG    MCHC 33.9  31.0 - 37.0 g/dL    RDW 04.5  40.9 - 81.1 %    PLATELET 242  135 - 420 K/uL    MPV 9.5  9.2 - 11.8 FL    NEUTROPHILS 57  40 - 73 %    LYMPHOCYTES 35  21 - 52 %    MONOCYTES 4  3 - 10 %    EOSINOPHILS 4  0 - 5 %    BASOPHILS 0  0 - 2 %    ABS. NEUTROPHILS 3.9  1.8 - 8.0 K/UL    ABS. LYMPHOCYTES 2.5  0.9 - 3.6 K/UL  ABS. MONOCYTES 0.3  0.05 - 1.2 K/UL    ABS. EOSINOPHILS 0.3   0.0 - 0.4 K/UL    ABS. BASOPHILS 0.0  0.0 - 0.06 K/UL    DF AUTOMATED     METABOLIC PANEL, BASIC    Collection Time     02/22/13  7:02 PM       Result Value Ref Range    Sodium 140  136 - 145 mmol/L    Potassium 3.2 (*) 3.5 - 5.5 mmol/L    Chloride 104  100 - 108 mmol/L    CO2 26  21 - 32 mmol/L    Anion gap 10  3.0 - 18 mmol/L    Glucose 106 (*) 74 - 99 mg/dL    BUN 21 (*) 7.0 - 18 MG/DL    Creatinine 1.611.10  0.6 - 1.3 MG/DL    BUN/Creatinine ratio 19  12 - 20      GFR est AA >60  >60 ml/min/1.1173m2    GFR est non-AA 55 (*) >60 ml/min/1.7473m2    Calcium 9.6  8.5 - 10.1 MG/DL        DISPOSITION:   Diagnosis:   1. Chronic low back pain          Disposition: Discharged home    Follow-up Information    Follow up With Details Comments Contact Info    Aerian Dondra SpryK Joyner, NP  Schedule an appointment, for further evaluation and treatment in 2-3 days [address]  [place] [id number]  [phone]      [facility]  Return at once if symptoms worsen [address]  [place] [id number]  [phone]    Stephani PoliceBradley T Butkovich, MD  Schedule an appointment, for further evaluation and treatment in 2-3 days [address]  [address]  [place] [id number]  [phone]            Current Discharge Medication List      START taking these medications    Details   HYDROcodone-acetaminophen (NORCO) 7.5-325 mg per tablet Take 1 Tab by mouth every six (6) hours as needed for Pain. Max Daily Amount: 4 Tabs.  Qty: 20 Tab, Refills: 0      methylPREDNISolone (MEDROL, PAK,) 4 mg tablet Per dose pack instructions  Qty: 1 Package, Refills: 0         CONTINUE these medications which have NOT CHANGED    Details   folic acid (FOLVITE) 1 mg tablet Take  by mouth daily.      omeprazole (PRILOSEC) 20 mg capsule Take 20 mg by mouth daily.      omega-3 fatty acids-vitamin e (FISH OIL) 1,000 mg cap Take 1 capsule by mouth.      hydroxychloroquine (PLAQUENIL) 200 mg tablet Take 200 mg by mouth daily.      Hydrochlorothiazide (HYDRODIURIL) 12.5 mg tablet  Take 12.5 mg by mouth daily.      dicyclomine (BENTYL) 10 mg capsule Take 1 capsule by mouth four (4) times daily.  Qty: 120 capsule, Refills: 0              Teacher, early years/precribe Attestation Statement  Provider documentation written by: Donell Beersassandra Dalmida (6:55 PM), scribing for and in the presence of Amalia GreenhouseHumberto Denielle Bayard, MD, ED Provider.    PROVIDER ATTESTATION STATEMENT   I personally performed the services described in the documentation, reviewed the documentation, as recorded by the scribe in my presence, and it accurately and completely records my words and actions.  Amalia Greenhouse, MD. 8:46 PM   -------------------------------------------------------------------------------------------------------------------

## 2013-02-23 LAB — CBC WITH AUTOMATED DIFF
ABS. BASOPHILS: 0 10*3/uL (ref 0.0–0.06)
ABS. EOSINOPHILS: 0.3 10*3/uL (ref 0.0–0.4)
ABS. LYMPHOCYTES: 2.5 10*3/uL (ref 0.9–3.6)
ABS. MONOCYTES: 0.3 10*3/uL (ref 0.05–1.2)
ABS. NEUTROPHILS: 3.9 10*3/uL (ref 1.8–8.0)
BASOPHILS: 0 % (ref 0–2)
EOSINOPHILS: 4 % (ref 0–5)
HCT: 34.2 % — ABNORMAL LOW (ref 35.0–45.0)
HGB: 11.6 g/dL — ABNORMAL LOW (ref 12.0–16.0)
LYMPHOCYTES: 35 % (ref 21–52)
MCH: 28.5 PG (ref 24.0–34.0)
MCHC: 33.9 g/dL (ref 31.0–37.0)
MCV: 84 FL (ref 74.0–97.0)
MONOCYTES: 4 % (ref 3–10)
MPV: 9.5 FL (ref 9.2–11.8)
NEUTROPHILS: 57 % (ref 40–73)
PLATELET: 242 10*3/uL (ref 135–420)
RBC: 4.07 M/uL — ABNORMAL LOW (ref 4.20–5.30)
RDW: 13.4 % (ref 11.6–14.5)
WBC: 6.9 10*3/uL (ref 4.6–13.2)

## 2013-02-23 LAB — METABOLIC PANEL, BASIC
Anion gap: 10 mmol/L (ref 3.0–18)
BUN/Creatinine ratio: 19 (ref 12–20)
BUN: 21 MG/DL — ABNORMAL HIGH (ref 7.0–18)
CO2: 26 mmol/L (ref 21–32)
Calcium: 9.6 MG/DL (ref 8.5–10.1)
Chloride: 104 mmol/L (ref 100–108)
Creatinine: 1.1 MG/DL (ref 0.6–1.3)
GFR est AA: >60 mL/min/{1.73_m2} (ref >60)
GFR est non-AA: 55 mL/min/{1.73_m2} — ABNORMAL LOW (ref >60)
Glucose: 106 mg/dL — ABNORMAL HIGH (ref 74–99)
Potassium: 3.2 mmol/L — ABNORMAL LOW (ref 3.5–5.5)
Sodium: 140 mmol/L (ref 136–145)

## 2013-02-23 MED ORDER — METHYLPREDNISOLONE 4 MG TABS IN A DOSE PACK
4 mg | PACK | ORAL | Status: AC
Start: 2013-02-23 — End: ?

## 2013-02-23 MED ORDER — HYDROCODONE-ACETAMINOPHEN 7.5 MG-325 MG TAB
ORAL_TABLET | Freq: Four times a day (QID) | ORAL | Status: AC | PRN
Start: 2013-02-23 — End: ?

## 2013-02-23 MED FILL — HYDROCODONE-ACETAMINOPHEN 7.5 MG-325 MG TAB: ORAL | Qty: 1

## 2014-08-04 ENCOUNTER — Other Ambulatory Visit (HOSPITAL_COMMUNITY)
Admission: RE | Admit: 2014-08-04 | Discharge: 2014-08-04 | Disposition: A | Discharge status: Home / Self Care | Payer: Medicare HMO | Source: Ambulatory Visit | Attending: Family Medicine | Admitting: Family Medicine

## 2014-08-04 DIAGNOSIS — Z1151 Encounter for screening for human papillomavirus (HPV): Secondary | ICD-10-CM | POA: Insufficient documentation

## 2014-08-04 DIAGNOSIS — Z124 Encounter for screening for malignant neoplasm of cervix: Secondary | ICD-10-CM | POA: Insufficient documentation

## 2014-08-16 ENCOUNTER — Emergency Department (HOSPITAL_COMMUNITY)
Admission: EM | Admit: 2014-08-16 | Discharge: 2014-08-17 | Disposition: A | Discharge status: Home / Self Care | Payer: Medicare HMO | Source: Home / Self Care | Attending: Emergency Medicine | Admitting: Emergency Medicine

## 2014-08-16 ENCOUNTER — Encounter (HOSPITAL_COMMUNITY): Payer: Self-pay | Admitting: Emergency Medicine

## 2014-08-16 DIAGNOSIS — I10 Essential (primary) hypertension: Secondary | ICD-10-CM

## 2014-08-16 DIAGNOSIS — R110 Nausea: Secondary | ICD-10-CM | POA: Insufficient documentation

## 2014-08-16 DIAGNOSIS — R42 Dizziness and giddiness: Secondary | ICD-10-CM | POA: Insufficient documentation

## 2014-08-16 DIAGNOSIS — R109 Unspecified abdominal pain: Secondary | ICD-10-CM

## 2014-08-16 DIAGNOSIS — R1033 Periumbilical pain: Secondary | ICD-10-CM

## 2014-08-16 DIAGNOSIS — R197 Diarrhea, unspecified: Secondary | ICD-10-CM | POA: Insufficient documentation

## 2014-08-16 DIAGNOSIS — Z8719 Personal history of other diseases of the digestive system: Secondary | ICD-10-CM | POA: Insufficient documentation

## 2014-08-16 DIAGNOSIS — K921 Melena: Secondary | ICD-10-CM | POA: Diagnosis not present

## 2014-08-16 DIAGNOSIS — Z8739 Personal history of other diseases of the musculoskeletal system and connective tissue: Secondary | ICD-10-CM | POA: Insufficient documentation

## 2014-08-16 DIAGNOSIS — K550 Acute vascular disorders of intestine: Secondary | ICD-10-CM | POA: Diagnosis not present

## 2014-08-16 HISTORY — DX: Rheumatoid arthritis, unspecified: M06.9

## 2014-08-16 HISTORY — DX: Essential (primary) hypertension: I10

## 2014-08-16 HISTORY — DX: Noninfective gastroenteritis and colitis, unspecified: K52.9

## 2014-08-16 HISTORY — DX: Gastro-esophageal reflux disease without esophagitis: K21.9

## 2014-08-16 LAB — CBC
HEMATOCRIT: 36.8 % (ref 36.0–46.0)
HEMOGLOBIN: 12.8 g/dL (ref 12.0–15.0)
MCH: 29.3 pg (ref 26.0–34.0)
MCHC: 34.8 g/dL (ref 30.0–36.0)
MCV: 84.2 fL (ref 78.0–100.0)
Platelets: 237 10*3/uL (ref 150–400)
RBC: 4.37 MIL/uL (ref 3.87–5.11)
RDW: 12.7 % (ref 11.5–15.5)
WBC: 6.1 10*3/uL (ref 4.0–10.5)

## 2014-08-16 NOTE — ED Notes (Signed)
Pt. reports pain across her abdomen onset this evening , denies nausea or vomitting , no diarrhea  or fever .

## 2014-08-17 ENCOUNTER — Encounter (HOSPITAL_COMMUNITY): Payer: Self-pay | Admitting: Cardiology

## 2014-08-17 ENCOUNTER — Inpatient Hospital Stay (HOSPITAL_COMMUNITY)
Admission: EM | Admit: 2014-08-17 | Discharge: 2014-08-21 | DRG: 395 | Disposition: A | Discharge status: Home / Self Care | Payer: Medicare HMO | Attending: Internal Medicine | Admitting: Internal Medicine

## 2014-08-17 ENCOUNTER — Emergency Department (HOSPITAL_COMMUNITY): Payer: Medicare HMO

## 2014-08-17 DIAGNOSIS — K219 Gastro-esophageal reflux disease without esophagitis: Secondary | ICD-10-CM | POA: Diagnosis present

## 2014-08-17 DIAGNOSIS — K921 Melena: Secondary | ICD-10-CM | POA: Diagnosis present

## 2014-08-17 DIAGNOSIS — K559 Vascular disorder of intestine, unspecified: Secondary | ICD-10-CM | POA: Diagnosis present

## 2014-08-17 DIAGNOSIS — K550 Acute vascular disorders of intestine: Secondary | ICD-10-CM | POA: Diagnosis present

## 2014-08-17 DIAGNOSIS — Z79891 Long term (current) use of opiate analgesic: Secondary | ICD-10-CM | POA: Diagnosis not present

## 2014-08-17 DIAGNOSIS — I10 Essential (primary) hypertension: Secondary | ICD-10-CM | POA: Diagnosis present

## 2014-08-17 DIAGNOSIS — K55039 Acute (reversible) ischemia of large intestine, extent unspecified: Secondary | ICD-10-CM | POA: Diagnosis present

## 2014-08-17 DIAGNOSIS — M069 Rheumatoid arthritis, unspecified: Secondary | ICD-10-CM | POA: Diagnosis present

## 2014-08-17 DIAGNOSIS — K625 Hemorrhage of anus and rectum: Secondary | ICD-10-CM | POA: Diagnosis present

## 2014-08-17 DIAGNOSIS — Z7982 Long term (current) use of aspirin: Secondary | ICD-10-CM

## 2014-08-17 LAB — COMPREHENSIVE METABOLIC PANEL
ALK PHOS: 81 U/L (ref 38–126)
ALK PHOS: 92 U/L (ref 38–126)
ALT: 17 U/L (ref 14–54)
ALT: 19 U/L (ref 14–54)
AST: 22 U/L (ref 15–41)
AST: 30 U/L (ref 15–41)
Albumin: 3.6 g/dL (ref 3.5–5.0)
Albumin: 4.2 g/dL (ref 3.5–5.0)
Anion gap: 12 (ref 5–15)
Anion gap: 8 (ref 5–15)
BILIRUBIN TOTAL: 0.5 mg/dL (ref 0.3–1.2)
BILIRUBIN TOTAL: 0.7 mg/dL (ref 0.3–1.2)
BUN: 11 mg/dL (ref 6–20)
BUN: 18 mg/dL (ref 6–20)
CALCIUM: 10.1 mg/dL (ref 8.9–10.3)
CHLORIDE: 102 mmol/L (ref 101–111)
CO2: 24 mmol/L (ref 22–32)
CO2: 24 mmol/L (ref 22–32)
Calcium: 9.2 mg/dL (ref 8.9–10.3)
Chloride: 108 mmol/L (ref 101–111)
Creatinine, Ser: 0.97 mg/dL (ref 0.44–1.00)
Creatinine, Ser: 1.16 mg/dL — ABNORMAL HIGH (ref 0.44–1.00)
GFR calc Af Amer: 60 mL/min — ABNORMAL LOW (ref >60)
GFR calc non Af Amer: 52 mL/min — ABNORMAL LOW (ref >60)
GFR calc non Af Amer: >60 mL/min (ref >60)
GLUCOSE: 88 mg/dL (ref 65–99)
Glucose, Bld: 125 mg/dL — ABNORMAL HIGH (ref 65–99)
POTASSIUM: 3.3 mmol/L — AB (ref 3.5–5.1)
Potassium: 3.6 mmol/L (ref 3.5–5.1)
SODIUM: 138 mmol/L (ref 135–145)
Sodium: 140 mmol/L (ref 135–145)
Total Protein: 6.5 g/dL (ref 6.5–8.1)
Total Protein: 8.2 g/dL — ABNORMAL HIGH (ref 6.5–8.1)

## 2014-08-17 LAB — URINALYSIS, ROUTINE W REFLEX MICROSCOPIC
BILIRUBIN URINE: NEGATIVE
Glucose, UA: NEGATIVE mg/dL
Hgb urine dipstick: NEGATIVE
Ketones, ur: NEGATIVE mg/dL
Nitrite: NEGATIVE
PH: 5 (ref 5.0–8.0)
Protein, ur: NEGATIVE mg/dL
Specific Gravity, Urine: 1.009 (ref 1.005–1.030)
Urobilinogen, UA: 0.2 mg/dL (ref 0.0–1.0)

## 2014-08-17 LAB — CBC
HEMATOCRIT: 32.9 % — AB (ref 36.0–46.0)
Hemoglobin: 11.3 g/dL — ABNORMAL LOW (ref 12.0–15.0)
MCH: 29.1 pg (ref 26.0–34.0)
MCHC: 34.3 g/dL (ref 30.0–36.0)
MCV: 84.8 fL (ref 78.0–100.0)
Platelets: 186 10*3/uL (ref 150–400)
RBC: 3.88 MIL/uL (ref 3.87–5.11)
RDW: 13 % (ref 11.5–15.5)
WBC: 6.8 10*3/uL (ref 4.0–10.5)

## 2014-08-17 LAB — I-STAT CG4 LACTIC ACID, ED
LACTIC ACID, VENOUS: 0.57 mmol/L (ref 0.5–2.0)
Lactic Acid, Venous: 3.63 mmol/L (ref 0.5–2.0)
Lactic Acid, Venous: 0.6 mmol/L (ref 0.5–2.0)

## 2014-08-17 LAB — TYPE AND SCREEN
ABO/RH(D): O POS
Antibody Screen: NEGATIVE

## 2014-08-17 LAB — HEMATOCRIT: HCT: 31.6 % — ABNORMAL LOW (ref 36.0–46.0)

## 2014-08-17 LAB — HEMOGLOBIN: Hemoglobin: 10.8 g/dL — ABNORMAL LOW (ref 12.0–15.0)

## 2014-08-17 LAB — POC OCCULT BLOOD, ED: Fecal Occult Bld: POSITIVE — AB

## 2014-08-17 LAB — URINE MICROSCOPIC-ADD ON

## 2014-08-17 LAB — ABO/RH: ABO/RH(D): O POS

## 2014-08-17 LAB — MAGNESIUM: Magnesium: 1.8 mg/dL (ref 1.7–2.4)

## 2014-08-17 LAB — LIPASE, BLOOD: LIPASE: 46 U/L (ref 22–51)

## 2014-08-17 MED ORDER — HYDROCHLOROTHIAZIDE 12.5 MG PO CAPS
12.5000 mg | ORAL_CAPSULE | Freq: Every day | ORAL | Status: DC
  Administered 2014-08-18 – 2014-08-21 (×4): 12.5 mg via ORAL
  Filled 2014-08-17 (×4): qty 1

## 2014-08-17 MED ORDER — HYDROXYCHLOROQUINE SULFATE 200 MG PO TABS
400.0000 mg | ORAL_TABLET | Freq: Every day | ORAL | Status: DC
  Administered 2014-08-18 – 2014-08-21 (×4): 400 mg via ORAL
  Filled 2014-08-17 (×5): qty 2

## 2014-08-17 MED ORDER — PANTOPRAZOLE SODIUM 40 MG PO TBEC
40.0000 mg | DELAYED_RELEASE_TABLET | Freq: Every day | ORAL | Status: DC
  Administered 2014-08-18 – 2014-08-21 (×4): 40 mg via ORAL
  Filled 2014-08-17 (×4): qty 1

## 2014-08-17 MED ORDER — SODIUM CHLORIDE 0.9 % IV BOLUS (SEPSIS)
1000.0000 mL | Freq: Once | INTRAVENOUS | Status: AC
Start: 2014-08-17 — End: 2014-08-17
  Administered 2014-08-17: 1000 mL via INTRAVENOUS

## 2014-08-17 MED ORDER — ONDANSETRON HCL 4 MG PO TABS: 4.0000 mg | ORAL_TABLET | Freq: Four times a day (QID) | ORAL | Status: DC

## 2014-08-17 MED ORDER — SODIUM CHLORIDE 0.9 % IV BOLUS (SEPSIS)
1000.0000 mL | Freq: Once | INTRAVENOUS | Status: AC
  Administered 2014-08-17: 1000 mL via INTRAVENOUS

## 2014-08-17 MED ORDER — BUTALBITAL-APAP-CAFFEINE 50-300-40 MG PO CAPS: 1.0000 | ORAL_CAPSULE | Freq: Three times a day (TID) | ORAL | Status: DC | PRN

## 2014-08-17 MED ORDER — HYDROMORPHONE HCL 1 MG/ML IJ SOLN
1.0000 mg | INTRAMUSCULAR | Status: DC | PRN
  Administered 2014-08-17: 1 mg via INTRAVENOUS
  Filled 2014-08-17: qty 1

## 2014-08-17 MED ORDER — ONDANSETRON HCL 4 MG/2ML IJ SOLN
4.0000 mg | Freq: Once | INTRAMUSCULAR | Status: AC
  Administered 2014-08-17: 4 mg via INTRAVENOUS
  Filled 2014-08-17: qty 2

## 2014-08-17 MED ORDER — LISINOPRIL 10 MG PO TABS
10.0000 mg | ORAL_TABLET | Freq: Every day | ORAL | Status: DC
  Administered 2014-08-18 – 2014-08-21 (×4): 10 mg via ORAL
  Filled 2014-08-17 (×4): qty 1

## 2014-08-17 MED ORDER — DICYCLOMINE HCL 20 MG PO TABS: 20.0000 mg | ORAL_TABLET | Freq: Two times a day (BID) | ORAL | Status: DC

## 2014-08-17 MED ORDER — LISINOPRIL-HYDROCHLOROTHIAZIDE 10-12.5 MG PO TABS: 1.0000 | ORAL_TABLET | Freq: Every day | ORAL | Status: DC

## 2014-08-17 MED ORDER — AMITRIPTYLINE HCL 50 MG PO TABS
50.0000 mg | ORAL_TABLET | Freq: Every day | ORAL | Status: DC
  Administered 2014-08-17 – 2014-08-21 (×5): 50 mg via ORAL
  Filled 2014-08-17 (×5): qty 1

## 2014-08-17 MED ORDER — ONDANSETRON HCL 4 MG PO TABS: 4.0000 mg | ORAL_TABLET | Freq: Four times a day (QID) | ORAL | Status: DC | PRN

## 2014-08-17 MED ORDER — ONDANSETRON HCL 4 MG/2ML IJ SOLN: 4.0000 mg | Freq: Four times a day (QID) | INTRAMUSCULAR | Status: DC | PRN

## 2014-08-17 MED ORDER — HYDROMORPHONE HCL 1 MG/ML IJ SOLN
1.0000 mg | Freq: Once | INTRAMUSCULAR | Status: AC
  Administered 2014-08-17: 1 mg via INTRAVENOUS
  Filled 2014-08-17: qty 1

## 2014-08-17 MED ORDER — SODIUM CHLORIDE 0.9 % IJ SOLN
3.0000 mL | Freq: Two times a day (BID) | INTRAMUSCULAR | Status: DC
Start: 2014-08-17 — End: 2014-08-21
  Administered 2014-08-17 – 2014-08-21 (×7): 3 mL via INTRAVENOUS

## 2014-08-17 MED ORDER — OXYCODONE-ACETAMINOPHEN 5-325 MG PO TABS
1.0000 | ORAL_TABLET | Freq: Once | ORAL | Status: AC
  Administered 2014-08-17: 1 via ORAL
  Filled 2014-08-17: qty 1

## 2014-08-17 MED ORDER — IOHEXOL 350 MG/ML SOLN
100.0000 mL | Freq: Once | INTRAVENOUS | Status: AC | PRN
  Administered 2014-08-17: 100 mL via INTRAVENOUS

## 2014-08-17 MED ORDER — BUTALBITAL-APAP-CAFFEINE 50-325-40 MG PO TABS
1.0000 | ORAL_TABLET | Freq: Three times a day (TID) | ORAL | Status: DC | PRN
  Administered 2014-08-17: 1 via ORAL
  Filled 2014-08-17: qty 1

## 2014-08-17 MED ORDER — OXYCODONE-ACETAMINOPHEN 5-325 MG PO TABS: 1.0000 | ORAL_TABLET | Freq: Four times a day (QID) | ORAL | Status: DC | PRN

## 2014-08-17 NOTE — H&P (Signed)
[facility] History and Physical  Michelle Tapia HFW:[id number] DOB: Jul 27, 1960 DOA: 08/17/2014  Referring physician: Elwin Mocha, M.D. PCP: No primary care provider on file.   Chief Complaint: Abdominal pain  HPI: Michelle Tapia is a 54 y.o. female with a past medical history of hypertension, rheumatoid arthritis, several previous episodes of colitis, who comes for the second time to the emergency department due to sharp colicky abdominal pain and for several episodes of diarrhea, including the past four with bloody stools. She states that her symptoms started yesterday at around lunchtime. This is also associated with nausea, but no emesis. She denies fever, chills, but feels fatigued. She denies travel history or sick contacts.  She was here last night and was subsequently discharged home after significant improvement, but returned back to the emergency department after the pain recurred at home and the onset of bloody stools happened.   She states that several years ago she had a colonoscopy, but does not remember what was the exact diagnosis. She is currently feeling better and in no acute distress.   Review of Systems:  Constitutional:  No weight loss, night sweats, Fevers, chills, fatigue.  HEENT:  No headaches, Difficulty swallowing,Tooth/dental problems,Sore throat,  No sneezing, itching, ear ache, nasal congestion, post nasal drip,  Cardio-vascular:  No chest pain, Orthopnea, PND, swelling in lower extremities, anasarca, dizziness, palpitations  GI:  No , Positive abdominal pain, nausea,  indigestion, diarrhea, change in bowel habits, loss of appetite. occasional heartburn, denies vomiting.  Resp:  No shortness of breath with exertion or at rest. No excess mucus, no productive cough, No non-productive cough, No coughing up of blood.No change in color of mucus.No wheezing.No chest wall deformity  Skin:  no rash or lesions.  GU:  no dysuria, change in color of  urine, no urgency or frequency. No flank pain.  Musculoskeletal:  No joint pain or swelling. No decreased range of motion. No back pain.  Psych:  No change in mood or affect. No depression or anxiety. No memory loss.   Past Medical History  Diagnosis Date  . GERD (gastroesophageal reflux disease)   . Colitis   . RA (rheumatoid arthritis)   . Hypertension    History reviewed. No pertinent past surgical history. Social History:  reports that she has never smoked. She does not have any smokeless tobacco history on file. She reports that she does not drink alcohol or use illicit drugs.  No Known Allergies  History reviewed. No pertinent family history.   Prior to Admission medications   Medication Sig Start Date End Date Taking? Authorizing Provider  amitriptyline (ELAVIL) 50 MG tablet Take 50 mg by mouth daily. 07/05/14  Yes Historical Provider, MD  aspirin EC 81 MG tablet Take 81 mg by mouth daily.   Yes Historical Provider, MD  Butalbital-APAP-Caffeine 50-300-40 MG CAPS Take 1 tablet by mouth every 8 (eight) hours as needed (for pain).  08/16/14  Yes Historical Provider, MD  CALCIUM PO Take 1 tablet by mouth daily.   Yes Historical Provider, MD  folic acid (FOLVITE) 1 MG tablet Take 1 mg by mouth daily. 07/19/14  Yes Historical Provider, MD  hydroxychloroquine (PLAQUENIL) 200 MG tablet Take 400 mg by mouth daily. 08/09/14  Yes Historical Provider, MD  lisinopril-hydrochlorothiazide (PRINZIDE,ZESTORETIC) 10-12.5 MG per tablet Take 1 tablet by mouth daily. 07/08/14  Yes Historical Provider, MD  Omega-3 Fatty Acids (FISH OIL PO) Take 1 capsule by mouth daily.   Yes Historical Provider, MD  omeprazole (PRILOSEC) 20  MG capsule Take 20 mg by mouth daily. 08/14/14  Yes Historical Provider, MD  traMADol (ULTRAM) 50 MG tablet Take 50 mg by mouth every 8 (eight) hours as needed for moderate pain.  07/30/14  Yes Historical Provider, MD  dicyclomine (BENTYL) 20 MG tablet Take 1 tablet (20 mg total) by  mouth 2 (two) times daily. Patient not taking: Reported on 08/17/2014 08/17/14   Gwyneth Sprout, MD  ondansetron (ZOFRAN) 4 MG tablet Take 1 tablet (4 mg total) by mouth every 6 (six) hours. Patient not taking: Reported on 08/17/2014 08/17/14   Gwyneth Sprout, MD  oxyCODONE-acetaminophen (PERCOCET/ROXICET) 5-325 MG per tablet Take 1-2 tablets by mouth every 6 (six) hours as needed for severe pain. Patient not taking: Reported on 08/17/2014 08/17/14   Gwyneth Sprout, MD   Physical Exam: Filed Vitals:   08/17/14 1530 08/17/14 1620 08/17/14 1630 08/17/14 1645  BP: 140/68 132/73 139/66 137/73  Pulse: 61 65 57 56  Temp:      TempSrc:      Resp: 11 20 13 15   SpO2: 97% 99% 100% 100%    Wt Readings from Last 3 Encounters:  08/16/14 63.957 kg (141 lb)    General:  Appears calm and comfortable Eyes: PERRL, normal lids, irises & conjunctiva ENT: grossly normal hearing, lips & tongue Neck: no LAD, masses or thyromegaly Cardiovascular: RRR, no m/r/g. No LE edema. Telemetry: SR, no arrhythmias  Respiratory: CTA bilaterally, no w/r/r. Normal respiratory effort. Abdomen: soft, positive epigastric pain without guarding or rebound tenderness, no organomegaly palpated Skin: no rash or induration seen on limited exam Musculoskeletal: grossly normal tone BUE/BLE Psychiatric: grossly normal mood and affect, speech fluent and appropriate Neurologic: grossly non-focal.          Labs on Admission:  Basic Metabolic Panel:  Recent Labs Lab 08/16/14 2349 08/17/14 1136  NA 138 140  K 3.3* 3.6  CL 102 108  CO2 24 24  GLUCOSE 125* 88  BUN 18 11  CREATININE 1.16* 0.97  CALCIUM 10.1 9.2   Liver Function Tests:  Recent Labs Lab 08/16/14 2349 08/17/14 1136  AST 30 22  ALT 19 17  ALKPHOS 92 81  BILITOT 0.5 0.7  PROT 8.2* 6.5  ALBUMIN 4.2 3.6    Recent Labs Lab 08/16/14 2349  LIPASE 46   No results for input(s): AMMONIA in the last 168 hours. CBC:  Recent Labs Lab 08/16/14 2349  08/17/14 1136  WBC 6.1 6.8  HGB 12.8 11.3*  HCT 36.8 32.9*  MCV 84.2 84.8  PLT 237 186    Radiological Exams on Admission: Ct Cta Abd/pel W/cm &/or W/o Cm  08/17/2014   CLINICAL DATA:  Abdominal pain and cramping. Blood per rectum. Nausea.  EXAM: CTA ABDOMEN AND PELVIS WITHOUT AND WITH CONTRAST  TECHNIQUE: Multidetector CT imaging of the abdomen and pelvis was performed using the standard protocol during bolus administration of intravenous contrast. Multiplanar reconstructed images and MIPs were obtained and reviewed to evaluate the vascular anatomy.  CONTRAST:  10/17/2014 OMNIPAQUE IOHEXOL 350 MG/ML SOLN  COMPARISON:  None.  FINDINGS: Lower chest: Lung bases are clear.  Hepatobiliary: 3 cm rounded lesion in the dome of the RIGHT hepatic lobe has a peripheral enhancement typical of hemangioma. No duct dilatation. The gallbladder is normal.  Pancreas: Pancreas is normal. No ductal dilatation. No pancreatic inflammation.  Spleen: Normal spleen  Adrenals/urinary tract: Adrenal glands and kidneys are normal. The ureters and bladder normal.  Stomach/Bowel: The stomach, small bowel, appendix, cecum normal.  There is a 10 cm segment of circumferential bowel wall thickening involving the distal transverse colon and proximal descending colon (splenic flexure the colon). There is edema within the the bowel wall over this segment. There is no evidence of diverticulosis. No the colonic mass.  The more distal descending colon, sigmoid colon, and rectum are normal.  Vascular/Lymphatic: Abdominal aorta is normal in caliber. The celiac trunk is widely patent. The branches of the celiac trunk are patent  The SMA is widely patent. The branches of the SMA are patent. In particular the branches to the LEFT colon arcades are patent. Inferior mesenteric artery is patent. Iliac arteries are normal.  Reproductive: Uterus and ovaries are normal.  Musculoskeletal: No aggressive osseous lesion.  Other: No free fluid.  Review of the MIP  images confirms the above findings.  IMPRESSION: 1. Segmental colitis with bowel wall edema at the splenic flexure with differential including ischemic colitis, inflammatory bowel disease, or infectious colitis. Watershed location is typical of vascular colitis; however however there is no proximal vascular disease. 2. The SMA, celiac trunk common and IMA widely patent without evidence atherosclerotic disease or stenosis. Findings conveyed toBLAIR WALDEN on 08/17/2014  at15:48.   Electronically Signed   By: Genevive Bi M.D.   On: 08/17/2014 15:50    EKG: Independently reviewed.  Vent. rate 61 BPM PR interval 192 ms QRS duration 94 ms QT/QTc 464/467 ms P-R-T axes 40 23 35  Sinus rhythm Low voltage, precordial leads No prior comparison.  Assessment/Plan Principal Problem:   Acute ischemic colitis Active Problems:   Benign essential HTN   RA (rheumatoid arthritis)   GERD (gastroesophageal reflux disease)   Admit to telemetry for close monitoring. Nothing by mouth. Monitor H&H and allow every 6 hours. Continue IV fluids. Continue analgesics and antiemetics. Consider GI evaluation if it persists or patient to follow-up with GI at the clinic.   Dr. Emelia Loron is following the patient from general surgery.  Code Status: Full code. DVT Prophylaxis: SCDs. Family CommunicationCoral Spikes Daughter [phone]  Disposition Plan: Home with outpatient follow-up.  Time spent: Over 60 minutes.  Bobette Mo [facility] Pager [phone].

## 2014-08-17 NOTE — ED Notes (Signed)
Patient transported to CT 

## 2014-08-17 NOTE — ED Notes (Signed)
Pt reports she was in the ER last night and worked up for abd pain. Dc'ed with pain medication but started having rectal bleeding once she got home. Reports some nausea.

## 2014-08-17 NOTE — Consult Note (Addendum)
Reason for Consult:abdominal pain Referring Physician: Dr Evelina Bucy  Michelle Tapia is an 54 y.o. female.  HPI: 35 yof who states she has colonoscopy sometime last 5-6 years, states had "colitis" last year also.   She presented with short history of abdominal pain, some nausea no emesis, passing flatus, no bm, no fever.  She was seen last night with elevated lactate that improved with fluids and was discharged home. She returned today with continued abdominal pain. Not hungry. Not eating much.   Past Medical History  Diagnosis Date  . GERD (gastroesophageal reflux disease)   . Colitis   . RA (rheumatoid arthritis)   . Hypertension     History reviewed. No pertinent past surgical history.  History reviewed. No pertinent family history.  Social History:  reports that she has never smoked. She does not have any smokeless tobacco history on file. She reports that she does not drink alcohol or use illicit drugs.  Allergies: No Known Allergies  Medications: I have reviewed the patient's current medications. She is on plaquenil.   Results for orders placed or performed during the hospital encounter of 08/17/14 (from the past 48 hour(s))  Comprehensive metabolic panel     Status: None   Collection Time: 08/17/14 11:36 AM  Result Value Ref Range   Sodium 140 135 - 145 mmol/L   Potassium 3.6 3.5 - 5.1 mmol/L   Chloride 108 101 - 111 mmol/L   CO2 24 22 - 32 mmol/L   Glucose, Bld 88 65 - 99 mg/dL   BUN 11 6 - 20 mg/dL   Creatinine, Ser 0.97 0.44 - 1.00 mg/dL   Calcium 9.2 8.9 - 10.3 mg/dL   Total Protein 6.5 6.5 - 8.1 g/dL   Albumin 3.6 3.5 - 5.0 g/dL   AST 22 15 - 41 U/L   ALT 17 14 - 54 U/L   Alkaline Phosphatase 81 38 - 126 U/L   Total Bilirubin 0.7 0.3 - 1.2 mg/dL   GFR calc non Af Amer >60 >60 mL/min   GFR calc Af Amer >60 >60 mL/min    Comment: (NOTE) The eGFR has been calculated using the CKD EPI equation. This calculation has not been validated in all clinical  situations. eGFR's persistently <60 mL/min signify possible Chronic Kidney Disease.    Anion gap 8 5 - 15  CBC     Status: Abnormal   Collection Time: 08/17/14 11:36 AM  Result Value Ref Range   WBC 6.8 4.0 - 10.5 K/uL   RBC 3.88 3.87 - 5.11 MIL/uL   Hemoglobin 11.3 (L) 12.0 - 15.0 g/dL   HCT 32.9 (L) 36.0 - 46.0 %   MCV 84.8 78.0 - 100.0 fL   MCH 29.1 26.0 - 34.0 pg   MCHC 34.3 30.0 - 36.0 g/dL   RDW 13.0 11.5 - 15.5 %   Platelets 186 150 - 400 K/uL  Type and screen     Status: None   Collection Time: 08/17/14 11:36 AM  Result Value Ref Range   ABO/RH(D) O POS    Antibody Screen NEG    Sample Expiration 08/20/2014   ABO/Rh     Status: None   Collection Time: 08/17/14 11:36 AM  Result Value Ref Range   ABO/RH(D) O POS   I-Stat CG4 Lactic Acid, ED     Status: None   Collection Time: 08/17/14  1:48 PM  Result Value Ref Range   Lactic Acid, Venous 0.57 0.5 - 2.0 mmol/L  POC occult  blood, ED     Status: Abnormal   Collection Time: 08/17/14  1:50 PM  Result Value Ref Range   Fecal Occult Bld POSITIVE (A) NEGATIVE    Ct Cta Abd/pel W/cm &/or W/o Cm  08/17/2014   CLINICAL DATA:  Abdominal pain and cramping. Blood per rectum. Nausea.  EXAM: CTA ABDOMEN AND PELVIS WITHOUT AND WITH CONTRAST  TECHNIQUE: Multidetector CT imaging of the abdomen and pelvis was performed using the standard protocol during bolus administration of intravenous contrast. Multiplanar reconstructed images and MIPs were obtained and reviewed to evaluate the vascular anatomy.  CONTRAST:  123m OMNIPAQUE IOHEXOL 350 MG/ML SOLN  COMPARISON:  None.  FINDINGS: Lower chest: Lung bases are clear.  Hepatobiliary: 3 cm rounded lesion in the dome of the RIGHT hepatic lobe has a peripheral enhancement typical of hemangioma. No duct dilatation. The gallbladder is normal.  Pancreas: Pancreas is normal. No ductal dilatation. No pancreatic inflammation.  Spleen: Normal spleen  Adrenals/urinary tract: Adrenal glands and kidneys are  normal. The ureters and bladder normal.  Stomach/Bowel: The stomach, small bowel, appendix, cecum normal.  There is a 10 cm segment of circumferential bowel wall thickening involving the distal transverse colon and proximal descending colon (splenic flexure the colon). There is edema within the the bowel wall over this segment. There is no evidence of diverticulosis. No the colonic mass.  The more distal descending colon, sigmoid colon, and rectum are normal.  Vascular/Lymphatic: Abdominal aorta is normal in caliber. The celiac trunk is widely patent. The branches of the celiac trunk are patent  The SMA is widely patent. The branches of the SMA are patent. In particular the branches to the LEFT colon arcades are patent. Inferior mesenteric artery is patent. Iliac arteries are normal.  Reproductive: Uterus and ovaries are normal.  Musculoskeletal: No aggressive osseous lesion.  Other: No free fluid.  Review of the MIP images confirms the above findings.  IMPRESSION: 1. Segmental colitis with bowel wall edema at the splenic flexure with differential including ischemic colitis, inflammatory bowel disease, or infectious colitis. Watershed location is typical of vascular colitis; however however there is no proximal vascular disease. 2. The SMA, celiac trunk common and IMA widely patent without evidence atherosclerotic disease or stenosis. Findings conveyed toBLAIR WALDEN on 08/17/2014  at15:48.   Electronically Signed   By: SSuzy BouchardM.D.   On: 08/17/2014 15:50    Review of Systems  Constitutional: Negative for fever and chills.  Respiratory: Negative for cough and shortness of breath.   Cardiovascular: Negative for chest pain.  Gastrointestinal: Positive for nausea, abdominal pain and blood in stool. Negative for vomiting.   Blood pressure 140/68, pulse 61, temperature 98.9 F (37.2 C), temperature source Oral, resp. rate 11, SpO2 97 %. Physical Exam  Vitals reviewed. Constitutional: She is oriented  to person, place, and time. She appears well-developed and well-nourished.  Neck: Neck supple.  Cardiovascular: Normal rate, regular rhythm and normal heart sounds.   Respiratory: Effort normal and breath sounds normal. She has no wheezes. She has no rales.  GI: Soft. Bowel sounds are normal. There is tenderness (mild tender periumbilical). No hernia.  Neurological: She is alert and oriented to person, place, and time.    Assessment/Plan: Likely ischemic colitis  Not sure about other episode last year. No risk factors for embolic disease. Could also be ibd related.  Would admit to medicine, we will follow Recommend volume resuscitation ensuring maps are elevated, npo bowel rest for now.  Discussed if resolves  conservatively will need colonoscopy and surgery possible if no resolution or worsens.    Kabeer Hoagland 08/17/2014, 4:12 PM

## 2014-08-17 NOTE — Discharge Instructions (Signed)
Abdominal Pain, Women °Abdominal (stomach, pelvic, or belly) pain can be caused by many things. It is important to tell your doctor: °· The location of the pain. °· Does it come and go or is it present all the time? °· Are there things that start the pain (eating certain foods, exercise)? °· Are there other symptoms associated with the pain (fever, nausea, vomiting, diarrhea)? °All of this is helpful to know when trying to find the cause of the pain. °CAUSES  °· Stomach: virus or bacteria infection, or ulcer. °· Intestine: appendicitis (inflamed appendix), regional ileitis (Crohn's disease), ulcerative colitis (inflamed colon), irritable bowel syndrome, diverticulitis (inflamed diverticulum of the colon), or cancer of the stomach or intestine. °· Gallbladder disease or stones in the gallbladder. °· Kidney disease, kidney stones, or infection. °· Pancreas infection or cancer. °· Fibromyalgia (pain disorder). °· Diseases of the female organs: °¨ Uterus: fibroid (non-cancerous) tumors or infection. °¨ Fallopian tubes: infection or tubal pregnancy. °¨ Ovary: cysts or tumors. °¨ Pelvic adhesions (scar tissue). °¨ Endometriosis (uterus lining tissue growing in the pelvis and on the pelvic organs). °¨ Pelvic congestion syndrome (female organs filling up with blood just before the menstrual period). °¨ Pain with the menstrual period. °¨ Pain with ovulation (producing an egg). °¨ Pain with an IUD (intrauterine device, birth control) in the uterus. °¨ Cancer of the female organs. °· Functional pain (pain not caused by a disease, may improve without treatment). °· Psychological pain. °· Depression. °DIAGNOSIS  °Your doctor will decide the seriousness of your pain by doing an examination. °· Blood tests. °· X-rays. °· Ultrasound. °· CT scan (computed tomography, special type of X-ray). °· MRI (magnetic resonance imaging). °· Cultures, for infection. °· Barium enema (dye inserted in the large intestine, to better view it with  X-rays). °· Colonoscopy (looking in intestine with a lighted tube). °· Laparoscopy (minor surgery, looking in abdomen with a lighted tube). °· Major abdominal exploratory surgery (looking in abdomen with a large incision). °TREATMENT  °The treatment will depend on the cause of the pain.  °· Many cases can be observed and treated at home. °· Over-the-counter medicines recommended by your caregiver. °· Prescription medicine. °· Antibiotics, for infection. °· Birth control pills, for painful periods or for ovulation pain. °· Hormone treatment, for endometriosis. °· Nerve blocking injections. °· Physical therapy. °· Antidepressants. °· Counseling with a psychologist or psychiatrist. °· Minor or major surgery. °HOME CARE INSTRUCTIONS  °· Do not take laxatives, unless directed by your caregiver. °· Take over-the-counter pain medicine only if ordered by your caregiver. Do not take aspirin because it can cause an upset stomach or bleeding. °· Try a clear liquid diet (broth or water) as ordered by your caregiver. Slowly move to a bland diet, as tolerated, if the pain is related to the stomach or intestine. °· Have a thermometer and take your temperature several times a day, and record it. °· Bed rest and sleep, if it helps the pain. °· Avoid sexual intercourse, if it causes pain. °· Avoid stressful situations. °· Keep your follow-up appointments and tests, as your caregiver orders. °· If the pain does not go away with medicine or surgery, you may try: °¨ Acupuncture. °¨ Relaxation exercises (yoga, meditation). °¨ Group therapy. °¨ Counseling. °SEEK MEDICAL CARE IF:  °· You notice certain foods cause stomach pain. °· Your home care treatment is not helping your pain. °· You need stronger pain medicine. °· You want your IUD removed. °· You feel faint or   lightheaded. °· You develop nausea and vomiting. °· You develop a rash. °· You are having side effects or an allergy to your medicine. °SEEK IMMEDIATE MEDICAL CARE IF:  °· Your  pain does not go away or gets worse. °· You have a fever. °· Your pain is felt only in portions of the abdomen. The right side could possibly be appendicitis. The left lower portion of the abdomen could be colitis or diverticulitis. °· You are passing blood in your stools (bright red or black tarry stools, with or without vomiting). °· You have blood in your urine. °· You develop chills, with or without a fever. °· You pass out. °MAKE SURE YOU:  °· Understand these instructions. °· Will watch your condition. °· Will get help right away if you are not doing well or get worse. °Document Released: 10/28/2006 Document Revised: 05/17/2013 Document Reviewed: 11/17/2008 °[employer]® Patient Information ©2015 [employer]. This information is not intended to replace advice given to you by your health care provider. Make sure you discuss any questions you have with your health care provider. ° °

## 2014-08-17 NOTE — ED Notes (Signed)
Pt alert x4 respirations easy non labored ambulated to bathroom without distress.

## 2014-08-17 NOTE — ED Notes (Signed)
Pt states 3 bloody bm since arrival. Dark red in color with clots

## 2014-08-17 NOTE — ED Provider Notes (Signed)
CSN: [id number]     Arrival date & time 08/17/14  1118 History   First MD Initiated Contact with Patient 08/17/14 1214     Chief Complaint  Patient presents with  . Rectal Bleeding     (Consider location/radiation/quality/duration/timing/severity/associated sxs/prior Treatment) HPI Comments: Patient here with abdominal cramping and rectal bleeding. Evaluated in the ER last night. Felt to have a viral illness, was doing better after hydrated. Initial lactate was 3.6., which improved after hydration. Patient was feeling better and discharged home. Here she started to develop abdominal cramping again and multiple episodes of rectal bleeding, described as blood clots without any hematochezia.  Patient is a 54 y.o. female presenting with hematochezia. The history is provided by the patient.  Rectal Bleeding Quality:  Maroon Amount:  Copious Duration:  4 hours Timing:  Constant Progression:  Worsening Chronicity:  New Context: spontaneously   Context: not diarrhea   Relieved by:  Nothing Worsened by:  Nothing tried Associated symptoms: abdominal pain and vomiting   Associated symptoms: no dizziness (cramping) and no fever     Past Medical History  Diagnosis Date  . GERD (gastroesophageal reflux disease)   . Colitis   . RA (rheumatoid arthritis)   . Hypertension    History reviewed. No pertinent past surgical history. History reviewed. No pertinent family history. History  Substance Use Topics  . Smoking status: Never Smoker   . Smokeless tobacco: Not on file  . Alcohol Use: No   OB History    No data available     Review of Systems  Constitutional: Negative for fever.  Respiratory: Negative for cough and shortness of breath.   Gastrointestinal: Positive for nausea, vomiting, abdominal pain, hematochezia and anal bleeding (dark blood with clots).  Neurological: Negative for dizziness (cramping).  All other systems reviewed and are negative.     Allergies  Review of  patient's allergies indicates no known allergies.  Home Medications   Prior to Admission medications   Medication Sig Start Date End Date Taking? Authorizing Provider  amitriptyline (ELAVIL) 50 MG tablet Take 50 mg by mouth daily. 07/05/14   Historical Provider, MD  Butalbital-APAP-Caffeine 50-300-40 MG CAPS Take 1 tablet by mouth every 8 (eight) hours as needed (for pain).  08/16/14   Historical Provider, MD  CALCIUM PO Take 1 tablet by mouth daily.    Historical Provider, MD  dicyclomine (BENTYL) 20 MG tablet Take 1 tablet (20 mg total) by mouth 2 (two) times daily. 08/17/14   Gwyneth Sprout, MD  folic acid (FOLVITE) 1 MG tablet Take 1 mg by mouth daily. 07/19/14   Historical Provider, MD  hydroxychloroquine (PLAQUENIL) 200 MG tablet Take 400 mg by mouth daily. 08/09/14   Historical Provider, MD  lisinopril-hydrochlorothiazide (PRINZIDE,ZESTORETIC) 10-12.5 MG per tablet Take 1 tablet by mouth daily. 07/08/14   Historical Provider, MD  Omega-3 Fatty Acids (FISH OIL PO) Take 1 capsule by mouth daily.    Historical Provider, MD  omeprazole (PRILOSEC) 20 MG capsule Take 20 mg by mouth daily. 08/14/14   Historical Provider, MD  ondansetron (ZOFRAN) 4 MG tablet Take 1 tablet (4 mg total) by mouth every 6 (six) hours. 08/17/14   Gwyneth Sprout, MD  oxyCODONE-acetaminophen (PERCOCET/ROXICET) 5-325 MG per tablet Take 1-2 tablets by mouth every 6 (six) hours as needed for severe pain. 08/17/14   Gwyneth Sprout, MD  traMADol (ULTRAM) 50 MG tablet Take 50 mg by mouth every 8 (eight) hours as needed for moderate pain.  07/30/14   Historical  Provider, MD   BP 158/87 mmHg  Pulse 69  Temp(Src) 98.9 F (37.2 C) (Oral)  Resp 18  SpO2 100% Physical Exam  Constitutional: She is oriented to person, place, and time. She appears well-developed and well-nourished. No distress.  HENT:  Head: Normocephalic and atraumatic.  Mouth/Throat: Oropharynx is clear and moist.  Eyes: EOM are normal. Pupils are equal, round, and  reactive to light.  Neck: Normal range of motion. Neck supple.  Cardiovascular: Normal rate and regular rhythm.  Exam reveals no friction rub.   No murmur heard. Pulmonary/Chest: Effort normal and breath sounds normal. No respiratory distress. She has no wheezes. She has no rales.  Abdominal: Soft. She exhibits no distension. There is tenderness (RUQ and LUQ). There is no rebound.  Musculoskeletal: Normal range of motion. She exhibits no edema.  Neurological: She is alert and oriented to person, place, and time. No cranial nerve deficit. She exhibits normal muscle tone. Coordination normal.  Skin: No rash noted. She is not diaphoretic.  Nursing note and vitals reviewed.   ED Course  Procedures (including critical care time) Labs Review Labs Reviewed  CBC - Abnormal; Notable for the following:    Hemoglobin 11.3 (*)    HCT 32.9 (*)    All other components within normal limits  COMPREHENSIVE METABOLIC PANEL  TYPE AND SCREEN  ABO/RH    Imaging Review Ct Cta Abd/pel W/cm &/or W/o Cm  08/17/2014   CLINICAL DATA:  Abdominal pain and cramping. Blood per rectum. Nausea.  EXAM: CTA ABDOMEN AND PELVIS WITHOUT AND WITH CONTRAST  TECHNIQUE: Multidetector CT imaging of the abdomen and pelvis was performed using the standard protocol during bolus administration of intravenous contrast. Multiplanar reconstructed images and MIPs were obtained and reviewed to evaluate the vascular anatomy.  CONTRAST:  OMNIPAQUE IOHEXOL 350 MG/ML SOLN  COMPARISON:  None.  FINDINGS: Lower chest: Lung bases are clear.  Hepatobiliary: 3 cm rounded lesion in the dome of the RIGHT hepatic lobe has a peripheral enhancement typical of hemangioma. No duct dilatation. The gallbladder is normal.  Pancreas: Pancreas is normal. No ductal dilatation. No pancreatic inflammation.  Spleen: Normal spleen  Adrenals/urinary tract: Adrenal glands and kidneys are normal. The ureters and bladder normal.  Stomach/Bowel: The stomach, small  bowel, appendix, cecum normal.  There is a 10 cm segment of circumferential bowel wall thickening involving the distal transverse colon and proximal descending colon (splenic flexure the colon). There is edema within the the bowel wall over this segment. There is no evidence of diverticulosis. No the colonic mass.  The more distal descending colon, sigmoid colon, and rectum are normal.  Vascular/Lymphatic: Abdominal aorta is normal in caliber. The celiac trunk is widely patent. The branches of the celiac trunk are patent  The SMA is widely patent. The branches of the SMA are patent. In particular the branches to the LEFT colon arcades are patent. Inferior mesenteric artery is patent. Iliac arteries are normal.  Reproductive: Uterus and ovaries are normal.  Musculoskeletal: No aggressive osseous lesion.  Other: No free fluid.  Review of the MIP images confirms the above findings.  IMPRESSION: 1. Segmental colitis with bowel wall edema at the splenic flexure with differential including ischemic colitis, inflammatory bowel disease, or infectious colitis. Watershed location is typical of vascular colitis; however however there is no proximal vascular disease. 2. The SMA, celiac trunk common and IMA widely patent without evidence atherosclerotic disease or stenosis. Findings conveyed toBLAIR Aleera Gilcrease on 08/17/2014  at15:48.   Electronically  Signed   By: Genevive Bi M.D.   On: 08/17/2014 15:50     EKG Interpretation   Date/Time:  Wednesday August 17 2014 16:19:01 EDT Ventricular Rate:  61 PR Interval:  192 QRS Duration: 94 QT Interval:  464 QTC Calculation: 467 R Axis:   23 Text Interpretation:  Sinus rhythm Low voltage, precordial leads No prior  for comparison Confirmed by Gwendolyn Grant  MD, Joni Norrod ([id number]) on 08/17/2014 4:31:05  PM      MDM   Final diagnoses:  Ischemic colitis  Melena    54 year old female here with abdominal cramping and rectal bleeding. Patient seen last night, labs ok. Lactate cleared  after hydration. Here multiple episodes of rectal bleeding. Patient had some bilateral upper quadrant pain again today. No pain out of proportion. With multiple bouts of rectal bleeding, will CTA her abdomen to look for possible mesenteric ischemia. Will hydrate and check a lactate. Lactate normal. CT shows 12 cm segment of ischemic bowel, however all vessels are patent. Surgery consulted, will plan on admission to medicine.  I have reviewed all labs and imaging and considered them in my medical decision making.   Elwin Mocha, MD 08/17/14 [phone]

## 2014-08-17 NOTE — ED Provider Notes (Addendum)
CSN: [id number]     Arrival date & time 08/16/14  2316 History   This chart was scribed for Gwyneth Sprout, MD by Arlan Organ, ED Scribe. This patient was seen in room D32C/D32C and the patient's care was started 12:14 AM.   Chief Complaint  Patient presents with  . Abdominal Pain   The history is provided by the patient. No language interpreter was used.    HPI Comments: Michelle Tapia is a 54 y.o. female with a PMHx of GERD and colitis-last flare up last year who presents to the Emergency Department complaining of constant, ongoing, sudden onset abdominal pain x 1 hour prior to arrival. Pain is described as cramping. No aggravating or alleviating factors at this time. She also reports nausea and dizziness. No OTC medications or home remedies attempted prior to arrival. She denies any vomiting, fever, or chills. No weakness or numbness to lower extremities. Last meal at 1 PM this afternoon. Pt reports 2 watery bowel movements since this evenings visit. No recent alcohol consumption. She denies any new medications. No previous history of abdominal surgeries. No known allergies to medications.  Past Medical History  Diagnosis Date  . GERD (gastroesophageal reflux disease)   . Colitis   . RA (rheumatoid arthritis)   . Hypertension    History reviewed. No pertinent past surgical history. No family history on file. History  Substance Use Topics  . Smoking status: Never Smoker   . Smokeless tobacco: Not on file  . Alcohol Use: No   OB History    No data available     Review of Systems  Constitutional: Negative for fever and chills.  Respiratory: Negative for cough and shortness of breath.   Gastrointestinal: Positive for nausea, abdominal pain and diarrhea. Negative for vomiting.  Genitourinary: Negative for dysuria.  Neurological: Negative for weakness and numbness.  Psychiatric/Behavioral: Negative for confusion.  All other systems reviewed and are  negative.     Allergies  Review of patient's allergies indicates no known allergies.  Home Medications   Prior to Admission medications   Not on File   Triage Vitals: BP 109/63 mmHg  Pulse 74  Temp(Src) 97.7 F (36.5 C) (Oral)  Resp 20  Wt 141 lb (63.957 kg)  SpO2 100%   Physical Exam  Constitutional: She is oriented to person, place, and time. She appears well-developed and well-nourished. No distress.  Appears uncomfortable   HENT:  Head: Normocephalic and atraumatic.  Eyes: EOM are normal.  Neck: Normal range of motion.  Cardiovascular: Normal rate, regular rhythm and normal heart sounds.   Pulmonary/Chest: Effort normal and breath sounds normal.  Abdominal: Soft. She exhibits no distension. There is tenderness. There is no rebound and no guarding.  Periumbilical tenderness noted  Genitourinary:  No CVA tenderness noted   Musculoskeletal: Normal range of motion.  Neurological: She is alert and oriented to person, place, and time.  Skin: Skin is warm and dry.  Psychiatric: She has a normal mood and affect. Judgment normal.  Nursing note and vitals reviewed.   ED Course  Procedures (including critical care time)  DIAGNOSTIC STUDIES: Oxygen Saturation is 100% on RA, Normal by my interpretation.    COORDINATION OF CARE: 12:19 AM- Will order Lipase, CMP, CBC, urinalysis.  Will give fluids, Dilaudid, and Zofran. Discussed treatment plan with pt at bedside and pt agreed to plan.     Labs Review  Labs Reviewed   COMPREHENSIVE METABOLIC PANEL - Abnormal; Notable for the following:  Potassium 3.3 (*)     Glucose, Bld 125 (*)     Creatinine, Ser 1.16 (*)     Total Protein 8.2 (*)     GFR calc non Af Amer 52 (*)     GFR calc Af Amer 60 (*)     All other components within normal limits   URINALYSIS, ROUTINE W REFLEX MICROSCOPIC (NOT AT [facility]) - Abnormal; Notable for the following:     Leukocytes, UA TRACE (*)     All other components within normal limits    I-STAT CG4 LACTIC ACID, ED - Abnormal; Notable for the following:     Lactic Acid, Venous 3.63 (*)     All other components within normal limits   LIPASE, BLOOD   CBC   URINE MICROSCOPIC-ADD ON    Imaging Review No results found.   EKG Interpretation None      MDM   Final diagnoses:  Abdominal cramping    Patient with a prior history of colitis and rheumatoid arthritis who comes in with an hours worth of abdominal cramping and diarrhea that started when she arrived tear. She appears very uncomfortable on exam and is complaining of diffuse periumbilical pain. She appears to have some discomfort with palpation but no focal point tenderness. It was not associated with eating but she states this is how her prior colitis had started approximately 1 year ago. She was given 1 dose of IV pain medicine and nausea medicine. Vital signs of been within normal limits. Low suspicion for appendicitis, diverticulitis, cholecystitis or pancreatitis at this time. Low suspicion for mesenteric ischemia as other than hypertension patient has no other risk factors. CBC, CMP, lipase, UA and lactic acid sent.  Labs are unremarkable except for an elevated lactate of 3.63. Will give a second liter of fluid and recheck lactate.  2:04 AM On reevaluation patient's pain and nausea are improved. After second liter of fluid will reevaluate.  3:28 AM Repeat lactic acid is within normal limits. Patient will be discharged home. Discussed with patient and her family and they're comfortable with this plan. She was given strict return precautions and she'll follow-up with her PCP or her gastroenterologist if symptoms do not completely resolve.  I personally performed the services described in this documentation, which was scribed in my presence.  The recorded information has been reviewed and considered.   Gwyneth Sprout, MD 08/17/14 [id number]  Gwyneth Sprout, MD 08/17/14 [phone]

## 2014-08-18 DIAGNOSIS — K219 Gastro-esophageal reflux disease without esophagitis: Secondary | ICD-10-CM

## 2014-08-18 LAB — COMPREHENSIVE METABOLIC PANEL
ALK PHOS: 78 U/L (ref 38–126)
ALT: 15 U/L (ref 14–54)
AST: 19 U/L (ref 15–41)
Albumin: 3.4 g/dL — ABNORMAL LOW (ref 3.5–5.0)
Anion gap: 6 (ref 5–15)
BILIRUBIN TOTAL: 0.5 mg/dL (ref 0.3–1.2)
BUN: 7 mg/dL (ref 6–20)
CALCIUM: 9.1 mg/dL (ref 8.9–10.3)
CO2: 27 mmol/L (ref 22–32)
Chloride: 107 mmol/L (ref 101–111)
Creatinine, Ser: 0.92 mg/dL (ref 0.44–1.00)
GFR calc Af Amer: >60 mL/min (ref >60)
Glucose, Bld: 78 mg/dL (ref 65–99)
Potassium: 3.8 mmol/L (ref 3.5–5.1)
Sodium: 140 mmol/L (ref 135–145)
TOTAL PROTEIN: 6.6 g/dL (ref 6.5–8.1)

## 2014-08-18 LAB — CBC
HEMATOCRIT: 32.2 % — AB (ref 36.0–46.0)
Hemoglobin: 10.9 g/dL — ABNORMAL LOW (ref 12.0–15.0)
MCH: 28.7 pg (ref 26.0–34.0)
MCHC: 33.9 g/dL (ref 30.0–36.0)
MCV: 84.7 fL (ref 78.0–100.0)
PLATELETS: 181 10*3/uL (ref 150–400)
RBC: 3.8 MIL/uL — ABNORMAL LOW (ref 3.87–5.11)
RDW: 13 % (ref 11.5–15.5)
WBC: 4.7 10*3/uL (ref 4.0–10.5)

## 2014-08-18 NOTE — Progress Notes (Signed)
PROGRESS NOTE  Michelle Tapia Michelle Tapia:[id number] DOB: February 03, 1960 DOA: 08/17/2014 PCP: No primary care provider on file.   HPI: Michelle Tapia is a 54 y.o. female with hypertension, rheumatoid arthritis, several previous episodes of colitis, who comes for the second time to the emergency department due to sharp colicky abdominal pain and for several episodes of diarrhea, including the past four with bloody stools. She states that her symptoms started yesterday at around lunchtime. This is also associated with nausea, but no emesis. She denies fever, chills, but feels fatigued. She denies travel history or sick contacts. She was here last night and was subsequently discharged home after significant improvement, but returned back to the emergency department after the pain recurred at home and the onset of bloody stools happened. She states that several years ago she had a colonoscopy, but does not remember what the exact diagnosis was. She is currently feeling better and in no acute distress.   Subjective / 24 H Interval events - Still feeling intermittent, sharp epigastric pain that lasts for a few minutes at a time. Denies nausea or vomiting at this time. No fever or chills.   Assessment/Plan: Principal Problem:   Acute ischemic colitis Active Problems:   Benign essential HTN   RA (rheumatoid arthritis)   GERD (gastroesophageal reflux disease)  Ischemic colitis - likely cause of patient's abdominal pain and cramping - CT scan shows segmental colitis with bowel wall edema at the splenic flexure with differential including ischemic colitis, inflammatory bowel disease, or infectious colitis. Watershed location is typical of vascular colitis however no proximal vascular disease is seen. - General surgery consulted and are following patient - Full liquid diet, continue pain control and monitoring  Rectal bleeding - Likely in the setting of #1, - Continue to monitor CBC - Had a bloody bowel  movement today  Benign essential HTN - on lisinipril, HCTZ  Rheumatoid arthritis - on Plaquenil and Elavil  GERD - on pantoprazole   Diet: Diet full liquid Room service appropriate?: Yes; Fluid consistency:: Thin Fluids: none DVT Prophylaxis: SCDs  Code Status: Full Code Family Communication:  Discussed plan with patient and daughter at bedside Disposition Plan: Home when medically stable  Consultants:  General surgery, Dr. Emelia Loron  Procedures:  None   Antibiotics  Anti-infectives    Start     Dose/Rate Route Frequency Ordered Stop   08/18/14 1000  hydroxychloroquine (PLAQUENIL) tablet 400 mg     400 mg Oral Daily 08/17/14 2034         Studies  Ct Cta Abd/pel W/cm &/or W/o Cm  08/17/2014   CLINICAL DATA:  Abdominal pain and cramping. Blood per rectum. Nausea.  EXAM: CTA ABDOMEN AND PELVIS WITHOUT AND WITH CONTRAST  TECHNIQUE: Multidetector CT imaging of the abdomen and pelvis was performed using the standard protocol during bolus administration of intravenous contrast. Multiplanar reconstructed images and MIPs were obtained and reviewed to evaluate the vascular anatomy.  CONTRAST:  OMNIPAQUE IOHEXOL 350 MG/ML SOLN  COMPARISON:  None.  FINDINGS: Lower chest: Lung bases are clear.  Hepatobiliary: 3 cm rounded lesion in the dome of the RIGHT hepatic lobe has a peripheral enhancement typical of hemangioma. No duct dilatation. The gallbladder is normal.  Pancreas: Pancreas is normal. No ductal dilatation. No pancreatic inflammation.  Spleen: Normal spleen  Adrenals/urinary tract: Adrenal glands and kidneys are normal. The ureters and bladder normal.  Stomach/Bowel: The stomach, small bowel, appendix, cecum normal.  There is a 10 cm segment of circumferential  bowel wall thickening involving the distal transverse colon and proximal descending colon (splenic flexure the colon). There is edema within the the bowel wall over this segment. There is no evidence of  diverticulosis. No the colonic mass.  The more distal descending colon, sigmoid colon, and rectum are normal.  Vascular/Lymphatic: Abdominal aorta is normal in caliber. The celiac trunk is widely patent. The branches of the celiac trunk are patent  The SMA is widely patent. The branches of the SMA are patent. In particular the branches to the LEFT colon arcades are patent. Inferior mesenteric artery is patent. Iliac arteries are normal.  Reproductive: Uterus and ovaries are normal.  Musculoskeletal: No aggressive osseous lesion.  Other: No free fluid.  Review of the MIP images confirms the above findings.  IMPRESSION: 1. Segmental colitis with bowel wall edema at the splenic flexure with differential including ischemic colitis, inflammatory bowel disease, or infectious colitis. Watershed location is typical of vascular colitis; however however there is no proximal vascular disease. 2. The SMA, celiac trunk common and IMA widely patent without evidence atherosclerotic disease or stenosis. Findings conveyed toBLAIR WALDEN on 08/17/2014  at15:48.   Electronically Signed   By: Genevive Bi M.D.   On: 08/17/2014 15:50    Objective  Filed Vitals:   08/17/14 1645 08/17/14 2038 08/18/14 0359 08/18/14 0854  BP: 137/73 145/61 123/72 122/72  Pulse: 56 73 59 66  Temp:  98.6 F (37 C) 98.5 F (36.9 C) 98.2 F (36.8 C)  TempSrc:  Oral Oral Oral  Resp: 15 16 18 18   Height:  5\' 2"  (1.575 m)    Weight:  66.3 kg (146 lb 2.6 oz)    SpO2: 100% 97% 97% 100%    Intake/Output Summary (Last 24 hours) at 08/18/14 1419 Last data filed at 08/18/14 0855  Gross per 24 hour  Intake      0 ml  Output    850 ml  Net   -850 ml   Filed Weights   08/17/14 2038  Weight: 66.3 kg (146 lb 2.6 oz)    Exam:  GENERAL: Well-developed female lying supine in bed intermittently holding epigastric region with hand due to sharp pain.   HEENT: no scleral icterus   LUNGS: Clear to auscultation. No wheezing or  crackles  HEART: Regular rate and rhythm without murmur. 2+ pulses, no JVD, no peripheral edema  ABDOMEN: Soft. TTP of LUQ. No rebound tenderness or guarding. + bowel sounds.   EXTREMITIES: Without any cyanosis, clubbing, rash, lesions or edema.  Data Reviewed: Basic Metabolic Panel:  Recent Labs Lab 08/16/14 2349 08/17/14 1136 08/17/14 2127 08/18/14 0500  NA 138 140  --  140  K 3.3* 3.6  --  3.8  CL 102 108  --  107  CO2 24 24  --  27  GLUCOSE 125* 88  --  78  BUN 18 11  --  7  CREATININE 1.16* 0.97  --  0.92  CALCIUM 10.1 9.2  --  9.1  MG  --   --  1.8  --    Liver Function Tests:  Recent Labs Lab 08/16/14 2349 08/17/14 1136 08/18/14 0500  AST 30 22 19   ALT 19 17 15   ALKPHOS 92 81 78  BILITOT 0.5 0.7 0.5  PROT 8.2* 6.5 6.6  ALBUMIN 4.2 3.6 3.4*    Recent Labs Lab 08/16/14 2349  LIPASE 46   CBC:  Recent Labs Lab 08/16/14 2349 08/17/14 1136 08/17/14 2127 08/18/14 0500  WBC  6.1 6.8  --  4.7  HGB 12.8 11.3* 10.8* 10.9*  HCT 36.8 32.9* 31.6* 32.2*  MCV 84.2 84.8  --  84.7  PLT 237 186  --  181    Scheduled Meds: . amitriptyline  50 mg Oral Daily  . lisinopril  10 mg Oral Daily   And  . hydrochlorothiazide  12.5 mg Oral Daily  . hydroxychloroquine  400 mg Oral Daily  . pantoprazole  40 mg Oral Daily  . sodium chloride  3 mL Intravenous Q12H   Continuous Infusions:   Time spent coordinating care: 25 minutes, greater than 50% of this time was spent in counseling, explanation of diagnosis, planning of further management, coordination of care. All images within past 24 hours personally reviewed.   Pamella Pert, MD [facility] Pager [phone]. If 7 PM - 7 AM, please contact night-coverage at [email or web address], password [facility] 08/18/2014, 2:19 PM  LOS: 1 day

## 2014-08-18 NOTE — Progress Notes (Signed)
Subjective: Feels better, some flatus, no more pain, no n/v  Objective: Vital signs in last 24 hours: Temp:  [98.2 F (36.8 C)-98.9 F (37.2 C)] 98.2 F (36.8 C) (08/04 0854) Pulse Rate:  [56-75] 66 (08/04 0854) Resp:  [11-20] 18 (08/04 0854) BP: (119-158)/(61-87) 122/72 mmHg (08/04 0854) SpO2:  [96 %-100 %] 100 % (08/04 0854) Weight:  [66.3 kg (146 lb 2.6 oz)] 66.3 kg (146 lb 2.6 oz) (08/03 2038) Last BM Date: 08/17/14  Intake/Output from previous day: 08/03 0701 - 08/04 0700 In: 0  Out: 850 [Urine:850] Intake/Output this shift:    GI: soft nontender  Lab Results:   Recent Labs  08/17/14 1136 08/17/14 2127 08/18/14 0500  WBC 6.8  --  4.7  HGB 11.3* 10.8* 10.9*  HCT 32.9* 31.6* 32.2*  PLT 186  --  181   BMET  Recent Labs  08/17/14 1136 08/18/14 0500  NA 140 140  K 3.6 3.8  CL 108 107  CO2 24 27  GLUCOSE 88 78  BUN 11 7  CREATININE 0.97 0.92  CALCIUM 9.2 9.1   PT/INR No results for input(s): LABPROT, INR in the last 72 hours. ABG No results for input(s): PHART, HCO3 in the last 72 hours.  Invalid input(s): PCO2, PO2  Studies/Results: Ct Cta Abd/pel W/cm &/or W/o Cm  08/17/2014   CLINICAL DATA:  Abdominal pain and cramping. Blood per rectum. Nausea.  EXAM: CTA ABDOMEN AND PELVIS WITHOUT AND WITH CONTRAST  TECHNIQUE: Multidetector CT imaging of the abdomen and pelvis was performed using the standard protocol during bolus administration of intravenous contrast. Multiplanar reconstructed images and MIPs were obtained and reviewed to evaluate the vascular anatomy.  CONTRAST:  OMNIPAQUE IOHEXOL 350 MG/ML SOLN  COMPARISON:  None.  FINDINGS: Lower chest: Lung bases are clear.  Hepatobiliary: 3 cm rounded lesion in the dome of the RIGHT hepatic lobe has a peripheral enhancement typical of hemangioma. No duct dilatation. The gallbladder is normal.  Pancreas: Pancreas is normal. No ductal dilatation. No pancreatic inflammation.  Spleen: Normal spleen   Adrenals/urinary tract: Adrenal glands and kidneys are normal. The ureters and bladder normal.  Stomach/Bowel: The stomach, small bowel, appendix, cecum normal.  There is a 10 cm segment of circumferential bowel wall thickening involving the distal transverse colon and proximal descending colon (splenic flexure the colon). There is edema within the the bowel wall over this segment. There is no evidence of diverticulosis. No the colonic mass.  The more distal descending colon, sigmoid colon, and rectum are normal.  Vascular/Lymphatic: Abdominal aorta is normal in caliber. The celiac trunk is widely patent. The branches of the celiac trunk are patent  The SMA is widely patent. The branches of the SMA are patent. In particular the branches to the LEFT colon arcades are patent. Inferior mesenteric artery is patent. Iliac arteries are normal.  Reproductive: Uterus and ovaries are normal.  Musculoskeletal: No aggressive osseous lesion.  Other: No free fluid.  Review of the MIP images confirms the above findings.  IMPRESSION: 1. Segmental colitis with bowel wall edema at the splenic flexure with differential including ischemic colitis, inflammatory bowel disease, or infectious colitis. Watershed location is typical of vascular colitis; however however there is no proximal vascular disease. 2. The SMA, celiac trunk common and IMA widely patent without evidence atherosclerotic disease or stenosis. Findings conveyed toBLAIR WALDEN on 08/17/2014  at15:48.   Electronically Signed   By: Genevive Bi M.D.   On: 08/17/2014 15:50    Anti-infectives:  Anti-infectives    Start     Dose/Rate Route Frequency Ordered Stop   08/18/14 1000  hydroxychloroquine (PLAQUENIL) tablet 400 mg     400 mg Oral Daily 08/17/14 2034        Assessment/Plan: Likely ischemic colitis  She is better, can have clears, needs to be up and around. If resolves will need referral to gi for consideration of colonoscopy given this  episode  [facility] 08/18/2014

## 2014-08-19 LAB — COMPREHENSIVE METABOLIC PANEL
ALBUMIN: 3.5 g/dL (ref 3.5–5.0)
ALT: 15 U/L (ref 14–54)
AST: 21 U/L (ref 15–41)
Alkaline Phosphatase: 78 U/L (ref 38–126)
Anion gap: 3 — ABNORMAL LOW (ref 5–15)
BUN: 7 mg/dL (ref 6–20)
CALCIUM: 9.6 mg/dL (ref 8.9–10.3)
CO2: 31 mmol/L (ref 22–32)
Chloride: 106 mmol/L (ref 101–111)
Creatinine, Ser: 0.98 mg/dL (ref 0.44–1.00)
GFR calc Af Amer: >60 mL/min (ref >60)
GFR calc non Af Amer: >60 mL/min (ref >60)
Glucose, Bld: 95 mg/dL (ref 65–99)
POTASSIUM: 3.6 mmol/L (ref 3.5–5.1)
SODIUM: 140 mmol/L (ref 135–145)
TOTAL PROTEIN: 7.1 g/dL (ref 6.5–8.1)
Total Bilirubin: 0.5 mg/dL (ref 0.3–1.2)

## 2014-08-19 LAB — CBC
HEMATOCRIT: 35.2 % — AB (ref 36.0–46.0)
Hemoglobin: 11.9 g/dL — ABNORMAL LOW (ref 12.0–15.0)
MCH: 28.6 pg (ref 26.0–34.0)
MCHC: 33.8 g/dL (ref 30.0–36.0)
MCV: 84.6 fL (ref 78.0–100.0)
PLATELETS: 211 10*3/uL (ref 150–400)
RBC: 4.16 MIL/uL (ref 3.87–5.11)
RDW: 12.9 % (ref 11.5–15.5)
WBC: 6.2 10*3/uL (ref 4.0–10.5)

## 2014-08-19 LAB — LACTIC ACID, PLASMA: Lactic Acid, Venous: 0.8 mmol/L (ref 0.5–2.0)

## 2014-08-19 NOTE — Progress Notes (Signed)
PROGRESS NOTE  Michelle Tapia FMB:[id number] DOB: 05/08/1960 DOA: 08/17/2014 PCP: No primary care provider on file.   HPI: Michelle Tapia is a 54 y.o. female with hypertension, rheumatoid arthritis, several previous episodes of colitis, who comes for the second time to the emergency department due to sharp colicky abdominal pain and for several episodes of diarrhea, including the past four with bloody stools. She states that her symptoms started yesterday at around lunchtime. This is also associated with nausea, but no emesis. She denies fever, chills, but feels fatigued. She denies travel history or sick contacts. She was here last night and was subsequently discharged home after significant improvement, but returned back to the emergency department after the pain recurred at home and the onset of bloody stools happened. She states that several years ago she had a colonoscopy, but does not remember what the exact diagnosis was. She is currently feeling better and in no acute distress.  Subjective / 24 H Interval events - She is feeling a lot better this morning, no more abdominal pain, no more blood in her stools  Assessment/Plan: Principal Problem:   Acute ischemic colitis Active Problems:   Benign essential HTN   RA (rheumatoid arthritis)   GERD (gastroesophageal reflux disease)  Ischemic colitis - likely cause of patient's abdominal pain and cramping - CT scan shows segmental colitis with bowel wall edema at the splenic flexure with differential including ischemic colitis, inflammatory bowel disease, or infectious colitis. Watershed location is typical of vascular colitis however no proximal vascular disease is seen. Her ischemic colitis is most likely due to transient spasm - General surgery consulted and are following patient - Advance her diet to full today - I discussed with Dr. Matthias Hughs from gastroenterology, this is very typical for ischemic colitis likely due to transient  spasm, he will be more than happy to have patient follow-up with the [facility] gastroenterology group as an outpatient, currently he sees no indications for a colonoscopy or any other further interventions from a GI standpoint. Patient is improving, I agree with this plan.  Rectal bleeding - Likely in the setting of #1, - Continue to monitor CBC, hemoglobin stable this morning, no further bleeding noted  Benign essential HTN - on lisinipril, HCTZ  Rheumatoid arthritis - on Plaquenil and Elavil  GERD - on pantoprazole   Diet: Diet full liquid Room service appropriate?: Yes; Fluid consistency:: Thin Fluids: none DVT Prophylaxis: SCDs  Code Status: Full Code Family Communication:  Discussed plan with patient and daughter at bedside Disposition Plan: Home when medically stable  Consultants:  General surgery, Dr. Emelia Loron  GI, Dr. Matthias Hughs - over the phone  Procedures:  None   Antibiotics  Anti-infectives    Start     Dose/Rate Route Frequency Ordered Stop   08/18/14 1000  hydroxychloroquine (PLAQUENIL) tablet 400 mg     400 mg Oral Daily 08/17/14 2034         Studies  Ct Cta Abd/pel W/cm &/or W/o Cm  08/17/2014   CLINICAL DATA:  Abdominal pain and cramping. Blood per rectum. Nausea.  EXAM: CTA ABDOMEN AND PELVIS WITHOUT AND WITH CONTRAST  TECHNIQUE: Multidetector CT imaging of the abdomen and pelvis was performed using the standard protocol during bolus administration of intravenous contrast. Multiplanar reconstructed images and MIPs were obtained and reviewed to evaluate the vascular anatomy.  CONTRAST:  OMNIPAQUE IOHEXOL 350 MG/ML SOLN  COMPARISON:  None.  FINDINGS: Lower chest: Lung bases are clear.  Hepatobiliary: 3 cm rounded  lesion in the dome of the RIGHT hepatic lobe has a peripheral enhancement typical of hemangioma. No duct dilatation. The gallbladder is normal.  Pancreas: Pancreas is normal. No ductal dilatation. No pancreatic inflammation.  Spleen:  Normal spleen  Adrenals/urinary tract: Adrenal glands and kidneys are normal. The ureters and bladder normal.  Stomach/Bowel: The stomach, small bowel, appendix, cecum normal.  There is a 10 cm segment of circumferential bowel wall thickening involving the distal transverse colon and proximal descending colon (splenic flexure the colon). There is edema within the the bowel wall over this segment. There is no evidence of diverticulosis. No the colonic mass.  The more distal descending colon, sigmoid colon, and rectum are normal.  Vascular/Lymphatic: Abdominal aorta is normal in caliber. The celiac trunk is widely patent. The branches of the celiac trunk are patent  The SMA is widely patent. The branches of the SMA are patent. In particular the branches to the LEFT colon arcades are patent. Inferior mesenteric artery is patent. Iliac arteries are normal.  Reproductive: Uterus and ovaries are normal.  Musculoskeletal: No aggressive osseous lesion.  Other: No free fluid.  Review of the MIP images confirms the above findings.  IMPRESSION: 1. Segmental colitis with bowel wall edema at the splenic flexure with differential including ischemic colitis, inflammatory bowel disease, or infectious colitis. Watershed location is typical of vascular colitis; however however there is no proximal vascular disease. 2. The SMA, celiac trunk common and IMA widely patent without evidence atherosclerotic disease or stenosis. Findings conveyed toBLAIR WALDEN on 08/17/2014  at15:48.   Electronically Signed   By: Genevive Bi M.D.   On: 08/17/2014 15:50    Objective  Filed Vitals:   08/18/14 1702 08/18/14 2003 08/19/14 0438 08/19/14 0756  BP: 118/72 114/70 115/63 110/68  Pulse: 73 69 56 63  Temp: 98.6 F (37 C) 97.3 F (36.3 C) 98.1 F (36.7 C) 98.2 F (36.8 C)  TempSrc: Oral Axillary Oral Oral  Resp: 16 17 16 16   Height:      Weight:  65.1 kg (143 lb 8.3 oz)    SpO2: 99% 96% 100% 97%    Intake/Output Summary (Last 24  hours) at 08/19/14 1353 Last data filed at 08/19/14 0546  Gross per 24 hour  Intake    240 ml  Output    450 ml  Net   -210 ml   Filed Weights   08/17/14 2038 08/18/14 2003  Weight: 66.3 kg (146 lb 2.6 oz) 65.1 kg (143 lb 8.3 oz)    Exam:  GENERAL: Well-developed female lying supine in bed intermittently holding epigastric region with hand due to sharp pain.   HEENT: no scleral icterus   LUNGS: Clear to auscultation. No wheezing or crackles  HEART: Regular rate and rhythm without murmur. 2+ pulses, no JVD, no peripheral edema  ABDOMEN: Soft. TTP of LUQ. No rebound tenderness or guarding. + bowel sounds.   EXTREMITIES: Without any cyanosis, clubbing, rash, lesions or edema.  Data Reviewed: Basic Metabolic Panel:  Recent Labs Lab 08/16/14 2349 08/17/14 1136 08/17/14 2127 08/18/14 0500 08/19/14 0545  NA 138 140  --  140 140  K 3.3* 3.6  --  3.8 3.6  CL 102 108  --  107 106  CO2 24 24  --  27 31  GLUCOSE 125* 88  --  78 95  BUN 18 11  --  7 7  CREATININE 1.16* 0.97  --  0.92 0.98  CALCIUM 10.1 9.2  --  9.1  9.6  MG  --   --  1.8  --   --    Liver Function Tests:  Recent Labs Lab 08/16/14 2349 08/17/14 1136 08/18/14 0500 08/19/14 0545  AST ALT ALKPHOS 92 81 78 78  BILITOT 0.5 0.7 0.5 0.5  PROT 8.2* 6.5 6.6 7.1  ALBUMIN 4.2 3.6 3.4* 3.5    Recent Labs Lab 08/16/14 2349  LIPASE 46   CBC:  Recent Labs Lab 08/16/14 2349 08/17/14 1136 08/17/14 2127 08/18/14 0500 08/19/14 0545  WBC 6.1 6.8  --  4.7 6.2  HGB 12.8 11.3* 10.8* 10.9* 11.9*  HCT 36.8 32.9* 31.6* 32.2* 35.2*  MCV 84.2 84.8  --  84.7 84.6  PLT 237 186  --  181 211    Scheduled Meds: . amitriptyline  50 mg Oral Daily  . lisinopril  10 mg Oral Daily   And  . hydrochlorothiazide  12.5 mg Oral Daily  . hydroxychloroquine  400 mg Oral Daily  . pantoprazole  40 mg Oral Daily  . sodium chloride  3 mL Intravenous Q12H   Continuous Infusions:   Time spent  coordinating care: 25 minutes, greater than 50% of this time was spent in counseling, explanation of diagnosis, planning of further management, coordination of care and discussing with GI   Pamella Pert, MD [facility] Pager [phone]. If 7 PM - 7 AM, please contact night-coverage at [email or web address], password [email or web address] 08/19/2014, 1:53 PM  LOS: 2 days

## 2014-08-19 NOTE — Care Management Important Message (Signed)
Important Message  Patient Details  Name: Michelle Tapia MRN: [id number] Date of Birth: 11-Aug-1960   Medicare Important Message Given:  Yes-second notification given    Orson Aloe 08/19/2014, 1:56 PM

## 2014-08-19 NOTE — Progress Notes (Signed)
Patient ID: Michelle Tapia, female   DOB: 05/06/1960, 54 y.o.   MRN: [id number]    Subjective: Pt feels well this morning.  Tolerating clear liquids.  No other blood BMs since yesterday afternoon  Objective: Vital signs in last 24 hours: Temp:  [97.3 F (36.3 C)-98.6 F (37 C)] 98.2 F (36.8 C) (08/05 0756) Pulse Rate:  [56-73] 63 (08/05 0756) Resp:  [16-17] 16 (08/05 0756) BP: (110-118)/(63-72) 110/68 mmHg (08/05 0756) SpO2:  [96 %-100 %] 97 % (08/05 0756) Weight:  [65.1 kg (143 lb 8.3 oz)] 65.1 kg (143 lb 8.3 oz) (08/04 2003) Last BM Date: 08/18/14  Intake/Output from previous day: 08/04 0701 - 08/05 0700 In: 480 [P.O.:480] Out: 450 [Urine:450] Intake/Output this shift:    PE: Abd: soft, NT, Nd, +BS Heart: regular  Lab Results:   Recent Labs  08/18/14 0500 08/19/14 0545  WBC 4.7 6.2  HGB 10.9* 11.9*  HCT 32.2* 35.2*  PLT 181 211   BMET  Recent Labs  08/18/14 0500 08/19/14 0545  NA 140 140  K 3.8 3.6  CL 107 106  CO2 27 31  GLUCOSE 78 95  BUN 7 7  CREATININE 0.92 0.98  CALCIUM 9.1 9.6   PT/INR No results for input(s): LABPROT, INR in the last 72 hours. CMP     Component Value Date/Time   NA 140 08/19/2014 0545   K 3.6 08/19/2014 0545   CL 106 08/19/2014 0545   CO2 31 08/19/2014 0545   GLUCOSE 95 08/19/2014 0545   BUN 7 08/19/2014 0545   CREATININE 0.98 08/19/2014 0545   CALCIUM 9.6 08/19/2014 0545   PROT 7.1 08/19/2014 0545   ALBUMIN 3.5 08/19/2014 0545   AST 21 08/19/2014 0545   ALT 15 08/19/2014 0545   ALKPHOS 78 08/19/2014 0545   BILITOT 0.5 08/19/2014 0545   GFRNONAA >60 08/19/2014 0545   GFRAA >60 08/19/2014 0545   Lipase     Component Value Date/Time   LIPASE 46 08/16/2014 2349       Studies/Results: Ct Cta Abd/pel W/cm &/or W/o Cm  08/17/2014   CLINICAL DATA:  Abdominal pain and cramping. Blood per rectum. Nausea.  EXAM: CTA ABDOMEN AND PELVIS WITHOUT AND WITH CONTRAST  TECHNIQUE: Multidetector CT imaging of the abdomen  and pelvis was performed using the standard protocol during bolus administration of intravenous contrast. Multiplanar reconstructed images and MIPs were obtained and reviewed to evaluate the vascular anatomy.  CONTRAST:  OMNIPAQUE IOHEXOL 350 MG/ML SOLN  COMPARISON:  None.  FINDINGS: Lower chest: Lung bases are clear.  Hepatobiliary: 3 cm rounded lesion in the dome of the RIGHT hepatic lobe has a peripheral enhancement typical of hemangioma. No duct dilatation. The gallbladder is normal.  Pancreas: Pancreas is normal. No ductal dilatation. No pancreatic inflammation.  Spleen: Normal spleen  Adrenals/urinary tract: Adrenal glands and kidneys are normal. The ureters and bladder normal.  Stomach/Bowel: The stomach, small bowel, appendix, cecum normal.  There is a 10 cm segment of circumferential bowel wall thickening involving the distal transverse colon and proximal descending colon (splenic flexure the colon). There is edema within the the bowel wall over this segment. There is no evidence of diverticulosis. No the colonic mass.  The more distal descending colon, sigmoid colon, and rectum are normal.  Vascular/Lymphatic: Abdominal aorta is normal in caliber. The celiac trunk is widely patent. The branches of the celiac trunk are patent  The SMA is widely patent. The branches of the SMA are patent. In particular  the branches to the LEFT colon arcades are patent. Inferior mesenteric artery is patent. Iliac arteries are normal.  Reproductive: Uterus and ovaries are normal.  Musculoskeletal: No aggressive osseous lesion.  Other: No free fluid.  Review of the MIP images confirms the above findings.  IMPRESSION: 1. Segmental colitis with bowel wall edema at the splenic flexure with differential including ischemic colitis, inflammatory bowel disease, or infectious colitis. Watershed location is typical of vascular colitis; however however there is no proximal vascular disease. 2. The SMA, celiac trunk common and IMA  widely patent without evidence atherosclerotic disease or stenosis. Findings conveyed toBLAIR WALDEN on 08/17/2014  at15:48.   Electronically Signed   By: Genevive Bi M.D.   On: 08/17/2014 15:50    Anti-infectives: Anti-infectives    Start     Dose/Rate Route Frequency Ordered Stop   08/18/14 1000  hydroxychloroquine (PLAQUENIL) tablet 400 mg     400 mg Oral Daily 08/17/14 2034         Assessment/Plan  ischemic colitis -patient continues to improve.  Agree with advancement to full liquids.  D/W Dr. Elvera Lennox yesterday after patient had another bloody BM to ask GI to see the patient for possible endoscopic evaluation of colitis.  LOS: 2 days    Rawan Riendeau E 08/19/2014, 9:56 AM Pager: [phone]

## 2014-08-20 DIAGNOSIS — I10 Essential (primary) hypertension: Secondary | ICD-10-CM

## 2014-08-20 DIAGNOSIS — K550 Acute vascular disorders of intestine: Principal | ICD-10-CM

## 2014-08-20 DIAGNOSIS — K921 Melena: Secondary | ICD-10-CM

## 2014-08-20 DIAGNOSIS — M069 Rheumatoid arthritis, unspecified: Secondary | ICD-10-CM

## 2014-08-20 DIAGNOSIS — K559 Vascular disorder of intestine, unspecified: Secondary | ICD-10-CM

## 2014-08-20 LAB — CBC
HEMATOCRIT: 35.2 % — AB (ref 36.0–46.0)
Hemoglobin: 11.9 g/dL — ABNORMAL LOW (ref 12.0–15.0)
MCH: 28.7 pg (ref 26.0–34.0)
MCHC: 33.8 g/dL (ref 30.0–36.0)
MCV: 85 fL (ref 78.0–100.0)
Platelets: 214 10*3/uL (ref 150–400)
RBC: 4.14 MIL/uL (ref 3.87–5.11)
RDW: 12.9 % (ref 11.5–15.5)
WBC: 5.5 10*3/uL (ref 4.0–10.5)

## 2014-08-20 NOTE — Progress Notes (Signed)
[facility] PROGRESS NOTE  Michelle Tapia KNL:[id number] DOB: 09-22-1960 DOA: 08/17/2014 PCP: No primary care provider on file. HPI/Subjective: 54 y.o. female PMHx HTN, RA, Essential HTN,  several previous episodes of colitis,   Comes for the second time to the emergency department due to sharp colicky abdominal pain and for several episodes of diarrhea, including the past four with bloody stools. She states that her symptoms started yesterday at around lunchtime. This is also associated with nausea, but no emesis. She denies fever, chills, but feels fatigued. She denies travel history or sick contacts. She was here last night and was subsequently discharged home after significant improvement, but returned back to the emergency department after the pain recurred at home and the onset of bloody stools happened. She states that several years ago she had a colonoscopy, but does not remember what the exact diagnosis was. She is currently feeling better and in no acute distress. 8/6 A/O 4, NAD, negative CP, negative SOB, negative N/V, negative abdominal pain. Patient states last episode of colitis 2014. Patient has records at home, occurred in [place]    Assessment/Plan: Ischemic colitis - likely cause of patient's abdominal pain and cramping - CT scan shows segmental colitis with bowel wall edema at the splenic flexure with differential including ischemic colitis, inflammatory bowel disease, or infectious colitis. Watershed location is typical of vascular colitis however no proximal vascular disease is seen. Her ischemic colitis is most likely due to transient spasm - General surgery consulted and are following patient - Advance to regular diet - Per Epic note problem discussed with Dr. Matthias Hughs from gastroenterology, this is very typical for ischemic colitis likely due to transient spasm, he will be more than happy to have patient follow-up with the [facility] gastroenterology group as an  outpatient,   Rectal bleeding - See ischemic colitis - Hemoglobin stable   Benign essential HTN - on lisinipril, HCTZ  Rheumatoid arthritis - on Plaquenil and Elavil  GERD - on pantoprazole    Code Status: Full Code Family Communication: Discussed plan with patient and daughter at bedside Disposition Plan: Home when medically stable  Consultants: Dr.Matthew Dwain Sarna ([facility])  Procedures: NA  Cultures NA  Antibiotics: NA  DVT prophylaxis SCD    Objective: Filed Vitals:   08/20/14 0926 08/20/14 1720 08/20/14 2006 08/21/14 0426  BP: 99/62 99/63 97/61  87/60  Pulse: 88 78 96 83  Temp: 98.2 F (36.8 C) 98.4 F (36.9 C) 98.9 F (37.2 C) 98.1 F (36.7 C)  TempSrc: Oral Oral Oral Oral  Resp: 18 17 16 17   Height:      Weight:   68.2 kg (150 lb 5.7 oz)   SpO2: 98% 100% 99% 98%    Intake/Output Summary (Last 24 hours) at 08/21/14 0723 Last data filed at 08/21/14 0551  Gross per 24 hour  Intake   1640 ml  Output      0 ml  Net   1640 ml   Filed Weights   08/17/14 2038 08/18/14 2003 08/20/14 2006  Weight: 66.3 kg (146 lb 2.6 oz) 65.1 kg (143 lb 8.3 oz) 68.2 kg (150 lb 5.7 oz)     Exam: General: A/O 4, negative N/V, negative postprandial abdominal pain, No acute respiratory distress Eyes: Negative headache, eye pain, double vision,negative scleral hemorrhage ENT: Negative Runny nose, negative ear pain, negative tinnitus, negative gingival bleeding, Neck:  Negative scars, masses, torticollis, lymphadenopathy, JVD Lungs: Clear to auscultation bilaterally without wheezes or crackles Cardiovascular: Regular rate and rhythm without murmur gallop  or rub normal S1 and S2 Abdomen:negative abdominal pain, negative dysphagia, nondistended, positive soft, bowel sounds, no rebound, no ascites, no appreciable mass Extremities: No significant cyanosis, clubbing, or edema bilateral lower extremities Psychiatric:  Negative depression, negative anxiety, negative fatigue,  negative mania  Neurologic:  Cranial nerves II through XII intact, tongue/uvula midline, all extremities muscle strength 5/5, sensation intact throughout, negative dysarthria, negative expressive aphasia, negative receptive aphasia.     Data Reviewed: Basic Metabolic Panel:  Recent Labs Lab 08/16/14 2349 08/17/14 1136 08/17/14 2127 08/18/14 0500 08/19/14 0545  NA 138 140  --  140 140  K 3.3* 3.6  --  3.8 3.6  CL 102 108  --  107 106  CO2 24 24  --  27 31  GLUCOSE 125* 88  --  78 95  BUN 18 11  --  7 7  CREATININE 1.16* 0.97  --  0.92 0.98  CALCIUM 10.1 9.2  --  9.1 9.6  MG  --   --  1.8  --   --    Liver Function Tests:  Recent Labs Lab 08/16/14 2349 08/17/14 1136 08/18/14 0500 08/19/14 0545  AST 30 22 19 21   ALT 19 17 15 15   ALKPHOS 92 81 78 78  BILITOT 0.5 0.7 0.5 0.5  PROT 8.2* 6.5 6.6 7.1  ALBUMIN 4.2 3.6 3.4* 3.5    Recent Labs Lab 08/16/14 2349  LIPASE 46   No results for input(s): AMMONIA in the last 168 hours. CBC:  Recent Labs Lab 08/16/14 2349 08/17/14 1136 08/17/14 2127 08/18/14 0500 08/19/14 0545 08/20/14 0432  WBC 6.1 6.8  --  4.7 6.2 5.5  HGB 12.8 11.3* 10.8* 10.9* 11.9* 11.9*  HCT 36.8 32.9* 31.6* 32.2* 35.2* 35.2*  MCV 84.2 84.8  --  84.7 84.6 85.0  PLT 237 186  --  181 211 214   Cardiac Enzymes: No results for input(s): CKTOTAL, CKMB, CKMBINDEX, TROPONINI in the last 168 hours. BNP (last 3 results) No results for input(s): BNP in the last 8760 hours.  ProBNP (last 3 results) No results for input(s): PROBNP in the last 8760 hours.  CBG: No results for input(s): GLUCAP in the last 168 hours.  No results found for this or any previous visit (from the past 240 hour(s)).   Studies: No results found.  Scheduled Meds: . amitriptyline  50 mg Oral Daily  . lisinopril  10 mg Oral Daily   And  . hydrochlorothiazide  12.5 mg Oral Daily  . hydroxychloroquine  400 mg Oral Daily  . pantoprazole  40 mg Oral Daily  . sodium  chloride  3 mL Intravenous Q12H   Continuous Infusions:   Principal Problem:   Acute ischemic colitis Active Problems:   Benign essential HTN   RA (rheumatoid arthritis)   GERD (gastroesophageal reflux disease)   Ischemic colitis   Melena    Time spent: 35 min    WOODS, CURTIS J  [facility] Pager [phone]. If 7PM-7AM, please contact night-coverage at [email or web address], password [facility] 08/21/2014, 7:23 AM  LOS: 4 days    Care during the described time interval was provided by me .  I have reviewed this patient's available data, including medical history, events of note, physical examination, and all test results as part of my evaluation. I have personally reviewed and interpreted all radiology studies.   [facility], MD [phone] Pager

## 2014-08-21 DIAGNOSIS — K559 Vascular disorder of intestine, unspecified: Secondary | ICD-10-CM | POA: Diagnosis present

## 2014-08-21 DIAGNOSIS — K625 Hemorrhage of anus and rectum: Secondary | ICD-10-CM | POA: Diagnosis present

## 2014-08-21 DIAGNOSIS — K921 Melena: Secondary | ICD-10-CM | POA: Diagnosis present

## 2014-08-21 MED ORDER — PANTOPRAZOLE SODIUM 40 MG PO TBEC: 40.0000 mg | DELAYED_RELEASE_TABLET | Freq: Every day | ORAL | Status: DC

## 2014-08-21 NOTE — Care Management (Signed)
CM reviewed record and spoke with patient to identify discharge needs, no discharge needs noted at this time. Patient will be discharge home with self care today. Patient denies any difficulties obtaining medications. Pateitn Instructed to contact Dr. Paulino Rily office PCP to arrange for hospital follow up appointment. Patient verbalized understanding teach back done. Patient will be transported home with family private vehicle.  CM will sign off. Beecher Mcardle RN BSN CNOR CM [phone]

## 2014-08-21 NOTE — Discharge Summary (Signed)
Physician Discharge Summary  Michelle Tapia LZJ:[id number] DOB: 10/01/1960 DOA: 08/17/2014  PCP: No primary care provider on file.  Admit date: 08/17/2014 Discharge date: 08/21/2014  Time spent: 40 minutes  Recommendations for Outpatient Follow-up:  Ischemic colitis - likely cause of patient's abdominal pain and cramping - CT scan shows segmental colitis with bowel wall edema at the splenic flexure with differential including ischemic colitis, inflammatory bowel disease, or infectious colitis. Watershed location is typical of vascular colitis however no proximal vascular disease is seen. Her ischemic colitis is most likely due to transient spasm - Patient has tolerated  regular diet last night and this morning without postprandial pain/N/V. - Per Epic note problem discussed with Dr. Matthias Hughs from gastroenterology, this is very typical for ischemic colitis likely due to transient spasm, he will be more than happy to have patient follow-up with the [facility] gastroenterology group as an outpatient. -Patient to follow-up with, Dr. Matthias Hughs [facility]) in 1-2 weeks.  Rectal bleeding - See ischemic colitis - Hemoglobin stable   Benign essential HTN - Discharge lisinopril 10 mg daily  -Discharge on HCTZ 12.5 mg daily   Rheumatoid arthritis - Continue Plaquenil 400 mg daily  -Continue  Elavil 50 mg daily  GERD - on pantoprazole   Discharge Diagnoses:  Principal Problem:   Acute ischemic colitis Active Problems:   Benign essential HTN   RA (rheumatoid arthritis)   GERD (gastroesophageal reflux disease)   Ischemic colitis   Melena   Rectal bleeding   Discharge Condition: Stable  Diet recommendation: Regular  Filed Weights   08/17/14 2038 08/18/14 2003 08/20/14 2006  Weight: 66.3 kg (146 lb 2.6 oz) 65.1 kg (143 lb 8.3 oz) 68.2 kg (150 lb 5.7 oz)    History of present illness:  55 y.o. BF PMHx HTN, RA, Essential HTN, several previous episodes of colitis,   Comes for the second  time to the emergency department due to sharp colicky abdominal pain and for several episodes of diarrhea, including the past four with bloody stools. She states that her symptoms started yesterday at around lunchtime. This is also associated with nausea, but no emesis. She denies fever, chills, but feels fatigued. She denies travel history or sick contacts. She was here last night and was subsequently discharged home after significant improvement, but returned back to the emergency department after the pain recurred at home and the onset of bloody stools happened. She states that several years ago she had a colonoscopy, but does not remember what the exact diagnosis was. She is currently feeling better and in no acute distress. During this hospitalization patient was found to have ischemic colitis which has occurred in the past. Last episode was 2014. This hospitalization patient was placed on bowel rest, and hydrated with resolution of symptoms. Patient will follow-up with [facility].  Consultants: Dr.Matthew Dwain Sarna ([facility])      Discharge Exam: Filed Vitals:   08/20/14 1720 08/20/14 2006 08/21/14 0426 08/21/14 0953  BP: 99/63 97/61 87/60  99/67  Pulse: 78 96 83 88  Temp: 98.4 F (36.9 C) 98.9 F (37.2 C) 98.1 F (36.7 C) 98.5 F (36.9 C)  TempSrc: Oral Oral Oral Oral  Resp: 17 16 17 18   Height:      Weight:  68.2 kg (150 lb 5.7 oz)    SpO2: 100% 99% 98% 100%    General: A/O 4, negative N/V, negative postprandial abdominal pain, No acute respiratory distress Eyes: Negative headache, eye pain, double vision,negative scleral hemorrhage ENT: Negative Runny nose,  negative ear pain, negative tinnitus, negative gingival bleeding, Neck: Negative scars, masses, torticollis, lymphadenopathy, JVD Lungs: Clear to auscultation bilaterally without wheezes or crackles Cardiovascular: Regular rate and rhythm without murmur gallop or rub normal S1 and S2 Abdomen:negative abdominal pain, negative  dysphagia, nondistended, positive soft, bowel sounds, no rebound, no ascites, no appreciable mass     Discharge Instructions     Medication List    STOP taking these medications        dicyclomine 20 MG tablet  Commonly known as:  BENTYL     omeprazole 20 MG capsule  Commonly known as:  PRILOSEC  Replaced by:  pantoprazole 40 MG tablet     ondansetron 4 MG tablet  Commonly known as:  ZOFRAN      TAKE these medications        amitriptyline 50 MG tablet  Commonly known as:  ELAVIL  Take 50 mg by mouth daily.     aspirin EC 81 MG tablet  Take 81 mg by mouth daily.     Butalbital-APAP-Caffeine 50-300-40 MG Caps  Take 1 tablet by mouth every 8 (eight) hours as needed (for pain).     CALCIUM PO  Take 1 tablet by mouth daily.     FISH OIL PO  Take 1 capsule by mouth daily.     folic acid 1 MG tablet  Commonly known as:  FOLVITE  Take 1 mg by mouth daily.     hydroxychloroquine 200 MG tablet  Commonly known as:  PLAQUENIL  Take 400 mg by mouth daily.     lisinopril-hydrochlorothiazide 10-12.5 MG per tablet  Commonly known as:  PRINZIDE,ZESTORETIC  Take 1 tablet by mouth daily.     oxyCODONE-acetaminophen 5-325 MG per tablet  Commonly known as:  PERCOCET/ROXICET  Take 1-2 tablets by mouth every 6 (six) hours as needed for severe pain.     pantoprazole 40 MG tablet  Commonly known as:  PROTONIX  Take 1 tablet (40 mg total) by mouth daily.     traMADol 50 MG tablet  Commonly known as:  ULTRAM  Take 50 mg by mouth every 8 (eight) hours as needed for moderate pain.       No Known Allergies Follow-up Information    Follow up with Florencia Reasons, MD. Schedule an appointment as soon as possible for a visit in 2 weeks.   Specialty:  Gastroenterology   Why:  Follow-up with Dr. Matthias Hughs [facility]) in 1-2 weeks for ischemic colitis.   Contact information:   [address] [place] [address] [phone]        The results of significant  diagnostics from this hospitalization (including imaging, microbiology, ancillary and laboratory) are listed below for reference.    Significant Diagnostic Studies: Ct Cta Abd/pel W/cm &/or W/o Cm  08/17/2014   CLINICAL DATA:  Abdominal pain and cramping. Blood per rectum. Nausea.  EXAM: CTA ABDOMEN AND PELVIS WITHOUT AND WITH CONTRAST  TECHNIQUE: Multidetector CT imaging of the abdomen and pelvis was performed using the standard protocol during bolus administration of intravenous contrast. Multiplanar reconstructed images and MIPs were obtained and reviewed to evaluate the vascular anatomy.  CONTRAST:  OMNIPAQUE IOHEXOL 350 MG/ML SOLN  COMPARISON:  None.  FINDINGS: Lower chest: Lung bases are clear.  Hepatobiliary: 3 cm rounded lesion in the dome of the RIGHT hepatic lobe has a peripheral enhancement typical of hemangioma. No duct dilatation. The gallbladder is normal.  Pancreas: Pancreas is normal. No ductal  dilatation. No pancreatic inflammation.  Spleen: Normal spleen  Adrenals/urinary tract: Adrenal glands and kidneys are normal. The ureters and bladder normal.  Stomach/Bowel: The stomach, small bowel, appendix, cecum normal.  There is a 10 cm segment of circumferential bowel wall thickening involving the distal transverse colon and proximal descending colon (splenic flexure the colon). There is edema within the the bowel wall over this segment. There is no evidence of diverticulosis. No the colonic mass.  The more distal descending colon, sigmoid colon, and rectum are normal.  Vascular/Lymphatic: Abdominal aorta is normal in caliber. The celiac trunk is widely patent. The branches of the celiac trunk are patent  The SMA is widely patent. The branches of the SMA are patent. In particular the branches to the LEFT colon arcades are patent. Inferior mesenteric artery is patent. Iliac arteries are normal.  Reproductive: Uterus and ovaries are normal.  Musculoskeletal: No aggressive osseous lesion.  Other:  No free fluid.  Review of the MIP images confirms the above findings.  IMPRESSION: 1. Segmental colitis with bowel wall edema at the splenic flexure with differential including ischemic colitis, inflammatory bowel disease, or infectious colitis. Watershed location is typical of vascular colitis; however however there is no proximal vascular disease. 2. The SMA, celiac trunk common and IMA widely patent without evidence atherosclerotic disease or stenosis. Findings conveyed toBLAIR WALDEN on 08/17/2014  at15:48.   Electronically Signed   By: Genevive Bi M.D.   On: 08/17/2014 15:50    Microbiology: No results found for this or any previous visit (from the past 240 hour(s)).   Labs: Basic Metabolic Panel:  Recent Labs Lab 08/16/14 2349 08/17/14 1136 08/17/14 2127 08/18/14 0500 08/19/14 0545  NA 138 140  --  140 140  K 3.3* 3.6  --  3.8 3.6  CL 102 108  --  107 106  CO2 24 24  --  27 31  GLUCOSE 125* 88  --  78 95  BUN 18 11  --  7 7  CREATININE 1.16* 0.97  --  0.92 0.98  CALCIUM 10.1 9.2  --  9.1 9.6  MG  --   --  1.8  --   --    Liver Function Tests:  Recent Labs Lab 08/16/14 2349 08/17/14 1136 08/18/14 0500 08/19/14 0545  AST 30 22 19 21   ALT 19 17 15 15   ALKPHOS 92 81 78 78  BILITOT 0.5 0.7 0.5 0.5  PROT 8.2* 6.5 6.6 7.1  ALBUMIN 4.2 3.6 3.4* 3.5    Recent Labs Lab 08/16/14 2349  LIPASE 46   No results for input(s): AMMONIA in the last 168 hours. CBC:  Recent Labs Lab 08/16/14 2349 08/17/14 1136 08/17/14 2127 08/18/14 0500 08/19/14 0545 08/20/14 0432  WBC 6.1 6.8  --  4.7 6.2 5.5  HGB 12.8 11.3* 10.8* 10.9* 11.9* 11.9*  HCT 36.8 32.9* 31.6* 32.2* 35.2* 35.2*  MCV 84.2 84.8  --  84.7 84.6 85.0  PLT 237 186  --  181 211 214   Cardiac Enzymes: No results for input(s): CKTOTAL, CKMB, CKMBINDEX, TROPONINI in the last 168 hours. BNP: BNP (last 3 results) No results for input(s): BNP in the last 8760 hours.  ProBNP (last 3 results) No results for  input(s): PROBNP in the last 8760 hours.  CBG: No results for input(s): GLUCAP in the last 168 hours.     Signed:  10/19/14, MD [facility] [phone] pager

## 2015-08-28 ENCOUNTER — Other Ambulatory Visit: Payer: Self-pay | Admitting: Family Medicine

## 2015-08-28 DIAGNOSIS — Z1231 Encounter for screening mammogram for malignant neoplasm of breast: Secondary | ICD-10-CM

## 2015-09-04 ENCOUNTER — Ambulatory Visit
Admission: RE | Admit: 2015-09-04 | Discharge: 2015-09-04 | Disposition: A | Discharge status: Home / Self Care | Payer: Medicare HMO | Source: Ambulatory Visit | Attending: Family Medicine | Admitting: Family Medicine

## 2015-09-04 DIAGNOSIS — Z1231 Encounter for screening mammogram for malignant neoplasm of breast: Secondary | ICD-10-CM

## 2015-09-08 ENCOUNTER — Emergency Department (HOSPITAL_COMMUNITY): Payer: Medicare HMO

## 2015-09-08 ENCOUNTER — Encounter (HOSPITAL_COMMUNITY): Payer: Self-pay

## 2015-09-08 ENCOUNTER — Emergency Department (HOSPITAL_COMMUNITY)
Admission: EM | Admit: 2015-09-08 | Discharge: 2015-09-09 | Disposition: A | Discharge status: Home / Self Care | Payer: Medicare HMO | Attending: Emergency Medicine | Admitting: Emergency Medicine

## 2015-09-08 DIAGNOSIS — Z79899 Other long term (current) drug therapy: Secondary | ICD-10-CM | POA: Diagnosis not present

## 2015-09-08 DIAGNOSIS — R1013 Epigastric pain: Secondary | ICD-10-CM | POA: Diagnosis present

## 2015-09-08 DIAGNOSIS — K529 Noninfective gastroenteritis and colitis, unspecified: Secondary | ICD-10-CM | POA: Insufficient documentation

## 2015-09-08 DIAGNOSIS — Z7982 Long term (current) use of aspirin: Secondary | ICD-10-CM | POA: Insufficient documentation

## 2015-09-08 DIAGNOSIS — I10 Essential (primary) hypertension: Secondary | ICD-10-CM | POA: Insufficient documentation

## 2015-09-08 LAB — COMPREHENSIVE METABOLIC PANEL
ALT: 18 U/L (ref 14–54)
ANION GAP: 9 (ref 5–15)
AST: 22 U/L (ref 15–41)
Albumin: 4.1 g/dL (ref 3.5–5.0)
Alkaline Phosphatase: 91 U/L (ref 38–126)
BUN: 17 mg/dL (ref 6–20)
CHLORIDE: 109 mmol/L (ref 101–111)
CO2: 22 mmol/L (ref 22–32)
Calcium: 10.4 mg/dL — ABNORMAL HIGH (ref 8.9–10.3)
Creatinine, Ser: 1.06 mg/dL — ABNORMAL HIGH (ref 0.44–1.00)
GFR calc Af Amer: >60 mL/min (ref >60)
GFR, EST NON AFRICAN AMERICAN: 57 mL/min — AB (ref >60)
Glucose, Bld: 126 mg/dL — ABNORMAL HIGH (ref 65–99)
POTASSIUM: 3.4 mmol/L — AB (ref 3.5–5.1)
Sodium: 140 mmol/L (ref 135–145)
Total Bilirubin: 0.5 mg/dL (ref 0.3–1.2)
Total Protein: 7.7 g/dL (ref 6.5–8.1)

## 2015-09-08 LAB — URINALYSIS, ROUTINE W REFLEX MICROSCOPIC
Bilirubin Urine: NEGATIVE
GLUCOSE, UA: NEGATIVE mg/dL
Hgb urine dipstick: NEGATIVE
Ketones, ur: NEGATIVE mg/dL
Nitrite: NEGATIVE
PH: 5 (ref 5.0–8.0)
Protein, ur: NEGATIVE mg/dL
SPECIFIC GRAVITY, URINE: 1.018 (ref 1.005–1.030)

## 2015-09-08 LAB — CBC
HEMATOCRIT: 36.3 % (ref 36.0–46.0)
HEMOGLOBIN: 12.1 g/dL (ref 12.0–15.0)
MCH: 28.1 pg (ref 26.0–34.0)
MCHC: 33.3 g/dL (ref 30.0–36.0)
MCV: 84.4 fL (ref 78.0–100.0)
Platelets: 259 10*3/uL (ref 150–400)
RBC: 4.3 MIL/uL (ref 3.87–5.11)
RDW: 12.9 % (ref 11.5–15.5)
WBC: 5.7 10*3/uL (ref 4.0–10.5)

## 2015-09-08 LAB — URINE MICROSCOPIC-ADD ON

## 2015-09-08 LAB — LIPASE, BLOOD: LIPASE: 36 U/L (ref 11–51)

## 2015-09-08 MED ORDER — CIPROFLOXACIN IN D5W 400 MG/200ML IV SOLN
400.0000 mg | Freq: Once | INTRAVENOUS | Status: AC
  Administered 2015-09-08: 400 mg via INTRAVENOUS
  Filled 2015-09-08: qty 200

## 2015-09-08 MED ORDER — ONDANSETRON 4 MG PO TBDP: ORAL_TABLET | ORAL | 0 refills | Status: DC

## 2015-09-08 MED ORDER — ONDANSETRON 4 MG PO TBDP
ORAL_TABLET | ORAL | Status: AC
  Filled 2015-09-08: qty 1

## 2015-09-08 MED ORDER — METRONIDAZOLE IN NACL 5-0.79 MG/ML-% IV SOLN
500.0000 mg | Freq: Once | INTRAVENOUS | Status: AC
  Administered 2015-09-08: 500 mg via INTRAVENOUS
  Filled 2015-09-08: qty 100

## 2015-09-08 MED ORDER — OXYCODONE-ACETAMINOPHEN 5-325 MG PO TABS
1.0000 | ORAL_TABLET | ORAL | Status: DC | PRN
  Administered 2015-09-08: 1 via ORAL

## 2015-09-08 MED ORDER — SODIUM CHLORIDE 0.9 % IV BOLUS (SEPSIS)
1000.0000 mL | Freq: Once | INTRAVENOUS | Status: AC
  Administered 2015-09-08: 1000 mL via INTRAVENOUS

## 2015-09-08 MED ORDER — CIPROFLOXACIN HCL 500 MG PO TABS: 500.0000 mg | ORAL_TABLET | Freq: Two times a day (BID) | ORAL | 0 refills | Status: DC

## 2015-09-08 MED ORDER — METRONIDAZOLE 500 MG PO TABS: 500.0000 mg | ORAL_TABLET | Freq: Four times a day (QID) | ORAL | 0 refills | Status: DC

## 2015-09-08 MED ORDER — HYDROMORPHONE HCL 1 MG/ML IJ SOLN
1.0000 mg | Freq: Once | INTRAMUSCULAR | Status: AC
  Administered 2015-09-08: 1 mg via INTRAVENOUS
  Filled 2015-09-08: qty 1

## 2015-09-08 MED ORDER — ONDANSETRON 4 MG PO TBDP
4.0000 mg | ORAL_TABLET | Freq: Once | ORAL | Status: AC | PRN
  Administered 2015-09-08: 4 mg via ORAL

## 2015-09-08 MED ORDER — TRAMADOL HCL 50 MG PO TABS: 50.0000 mg | ORAL_TABLET | Freq: Four times a day (QID) | ORAL | 0 refills | Status: DC | PRN

## 2015-09-08 MED ORDER — OXYCODONE-ACETAMINOPHEN 5-325 MG PO TABS
ORAL_TABLET | ORAL | Status: DC
Start: 2015-09-08 — End: 2015-09-09
  Filled 2015-09-08: qty 1

## 2015-09-08 MED ORDER — ONDANSETRON HCL 4 MG/2ML IJ SOLN
4.0000 mg | Freq: Once | INTRAMUSCULAR | Status: AC
  Administered 2015-09-08: 4 mg via INTRAVENOUS
  Filled 2015-09-08: qty 2

## 2015-09-08 NOTE — Discharge Instructions (Signed)
Follow-up with your doctor this week for recheck. Return for persistent vomiting

## 2015-09-08 NOTE — ED Provider Notes (Signed)
[facility]-EMERGENCY DEPT Provider Note   CSN: [id number] Arrival date & time: 09/08/15  1310     History   Chief Complaint Chief Complaint  Patient presents with  . Abdominal Pain    HPI Michelle Tapia is a 55 y.o. female.  Patient complains of abdominal pain. Patient has a history of colitis 2   The history is provided by the patient. No language interpreter was used.  Abdominal Pain   This is a recurrent problem. The current episode started more than 2 days ago. The problem occurs constantly. The problem has not changed since onset.The pain is associated with an unknown factor. The pain is located in the epigastric region. The quality of the pain is aching. The pain is at a severity of 4/10. The pain is moderate. Associated symptoms include anorexia. Pertinent negatives include diarrhea, frequency, hematuria and headaches. Nothing aggravates the symptoms. Nothing relieves the symptoms. Past workup includes GI consult. Her past medical history does not include ulcerative colitis.    Past Medical History:  Diagnosis Date  . Colitis   . GERD (gastroesophageal reflux disease)   . Hypertension   . RA (rheumatoid arthritis) [facility])     Patient Active Problem List   Diagnosis Date Noted  . Ischemic colitis (HCC)   . Melena   . Rectal bleeding   . Acute ischemic colitis (HCC) 08/17/2014  . Benign essential HTN 08/17/2014  . RA (rheumatoid arthritis) (HCC) 08/17/2014  . GERD (gastroesophageal reflux disease) 08/17/2014    History reviewed. No pertinent surgical history.  OB History    No data available       Home Medications    Prior to Admission medications   Medication Sig Start Date End Date Taking? Authorizing Provider  amitriptyline (ELAVIL) 50 MG tablet Take 50 mg by mouth daily. 07/05/14   Historical Provider, MD  aspirin EC 81 MG tablet Take 81 mg by mouth daily.    Historical Provider, MD  Butalbital-APAP-Caffeine 50-300-40 MG CAPS Take 1 tablet by mouth every  8 (eight) hours as needed (for pain).  08/16/14   Historical Provider, MD  CALCIUM PO Take 1 tablet by mouth daily.    Historical Provider, MD  ciprofloxacin (CIPRO) 500 MG tablet Take 1 tablet (500 mg total) by mouth 2 (two) times daily. 09/08/15   Bethann Berkshire, MD  folic acid (FOLVITE) 1 MG tablet Take 1 mg by mouth daily. 07/19/14   Historical Provider, MD  hydroxychloroquine (PLAQUENIL) 200 MG tablet Take 400 mg by mouth daily. 08/09/14   Historical Provider, MD  lisinopril-hydrochlorothiazide (PRINZIDE,ZESTORETIC) 10-12.5 MG per tablet Take 1 tablet by mouth daily. 07/08/14   Historical Provider, MD  metroNIDAZOLE (FLAGYL) 500 MG tablet Take 1 tablet (500 mg total) by mouth 4 (four) times daily. 09/08/15   Bethann Berkshire, MD  Omega-3 Fatty Acids (FISH OIL PO) Take 1 capsule by mouth daily.    Historical Provider, MD  ondansetron (ZOFRAN ODT) 4 MG disintegrating tablet 4mg  ODT q4 hours prn nausea/vomit 09/08/15   Bethann Berkshire, MD  oxyCODONE-acetaminophen (PERCOCET/ROXICET) 5-325 MG per tablet Take 1-2 tablets by mouth every 6 (six) hours as needed for severe pain. Patient not taking: Reported on 08/17/2014 08/17/14   Gwyneth Sprout, MD  pantoprazole (PROTONIX) 40 MG tablet Take 1 tablet (40 mg total) by mouth daily. 08/21/14   Drema Dallas, MD  traMADol (ULTRAM) 50 MG tablet Take 1 tablet (50 mg total) by mouth every 6 (six) hours as needed. 09/08/15   Bethann Berkshire, MD  Family History History reviewed. No pertinent family history.  Social History Social History  Substance Use Topics  . Smoking status: Never Smoker  . Smokeless tobacco: Never Used  . Alcohol use No     Allergies   Review of patient's allergies indicates no known allergies.   Review of Systems Review of Systems  Constitutional: Negative for appetite change and fatigue.  HENT: Negative for congestion, ear discharge and sinus pressure.   Eyes: Negative for discharge.  Respiratory: Negative for cough.   Cardiovascular:  Negative for chest pain.  Gastrointestinal: Positive for abdominal pain and anorexia. Negative for diarrhea.  Genitourinary: Negative for frequency and hematuria.  Musculoskeletal: Negative for back pain.  Skin: Negative for rash.  Neurological: Negative for seizures and headaches.  Psychiatric/Behavioral: Negative for hallucinations.     Physical Exam Updated Vital Signs BP 135/73   Pulse 72   Temp 99.3 F (37.4 C) (Oral)   Resp 16   Ht 5\' 2"  (1.575 m)   Wt 152 lb (68.9 kg)   SpO2 100%   BMI 27.80 kg/m   Physical Exam  Constitutional: She is oriented to person, place, and time. She appears well-developed.  HENT:  Head: Normocephalic.  Eyes: Conjunctivae and EOM are normal. No scleral icterus.  Neck: Neck supple. No thyromegaly present.  Cardiovascular: Normal rate and regular rhythm.  Exam reveals no gallop and no friction rub.   No murmur heard. Pulmonary/Chest: No stridor. She has no wheezes. She has no rales. She exhibits no tenderness.  Abdominal: She exhibits no distension. There is tenderness. There is no rebound.  Mild tenderness throughout  Musculoskeletal: Normal range of motion. She exhibits no edema.  Lymphadenopathy:    She has no cervical adenopathy.  Neurological: She is oriented to person, place, and time. She exhibits normal muscle tone. Coordination normal.  Skin: No rash noted. No erythema.  Psychiatric: She has a normal mood and affect. Her behavior is normal.     ED Treatments / Results  Labs (all labs ordered are listed, but only abnormal results are displayed) Labs Reviewed  COMPREHENSIVE METABOLIC PANEL - Abnormal; Notable for the following:       Result Value   Potassium 3.4 (*)    Glucose, Bld 126 (*)    Creatinine, Ser 1.06 (*)    Calcium 10.4 (*)    GFR calc non Af Amer 57 (*)    All other components within normal limits  URINALYSIS, ROUTINE W REFLEX MICROSCOPIC (NOT AT [facility]) - Abnormal; Notable for the following:    Leukocytes, UA  SMALL (*)    All other components within normal limits  URINE MICROSCOPIC-ADD ON - Abnormal; Notable for the following:    Squamous Epithelial / LPF 0-5 (*)    Bacteria, UA FEW (*)    All other components within normal limits  LIPASE, BLOOD  CBC    EKG  EKG Interpretation None       Radiology Ct Abdomen Pelvis W Contrast  Result Date: 09/08/2015 CLINICAL DATA:  Abdominal pain EXAM: CT ABDOMEN AND PELVIS WITH CONTRAST TECHNIQUE: Multidetector CT imaging of the abdomen and pelvis was performed using the standard protocol following bolus administration of intravenous contrast. CONTRAST:  100 mL Isovue-300 COMPARISON:  CT abdomen pelvis 08/17/2014 FINDINGS: Lower chest: No pulmonary nodules or masses. No pleural effusion. No visible pericardial effusion. Hepatobiliary: Predominantly hypodense lesion of the right hepatic lobe with nodular peripheral enhancement measures 3.0 x 2.0 cm, unchanged in size. Subcentimeter hypodensity in the lateral  right hepatic lobe is likely a hepatic cyst, but too small characterize accurately. Gallbladder is normal. No biliary ductal dilatation. Pancreas: Pancreatic contours are normal. No abnormal calcifications or mass lesions. No peripancreatic fluid collection. No pancreatic ductal dilatation. Spleen: Normal Adrenals/Urinary Tract: The adrenal glands are normal. The kidneys are normal. Stomach/Bowel: There is colonic wall thickening and mild surrounding inflammatory stranding extending from the splenic flexure to the sigmoid colon. No small bowel obstruction. No intra-abdominal fluid collection. Appendix normal. Vascular/Lymphatic: Mild aortic atherosclerotic calcification. No abdominal aortic aneurysm. The inferior vena cava, portal vein, splenic vein and superior mesenteric vein are patent. The aorta, bilateral renal arteries, celiac axis and superior mesenteric artery are patent. The visualized iliac vessels are normal. No abdominal or pelvic lymphadenopathy.  Reproductive: The uterus is normal. Normal left ovary. Right ovary not clearly identified. Trace free fluid in the pelvis. Other: None Musculoskeletal: The visualized extraperitoneal and extrathoracic soft tissues are normal. No focal osseous lesions. IMPRESSION: 1. Long segment colitis extending from the splenic fracture to the sigmoid colon, likely infectious or inflammatory. 2. Unchanged right hepatic lobe hemangioma. Electronically Signed   By: Deatra Robinson M.D.   On: 09/08/2015 21:28    Procedures Procedures (including critical care time)  Medications Ordered in ED Medications  oxyCODONE-acetaminophen (PERCOCET/ROXICET) 5-325 MG per tablet 1 tablet (1 tablet Oral Given 09/08/15 1329)  ondansetron (ZOFRAN-ODT) 4 MG disintegrating tablet (not administered)  oxyCODONE-acetaminophen (PERCOCET/ROXICET) 5-325 MG per tablet (not administered)  ciprofloxacin (CIPRO) IVPB 400 mg (400 mg Intravenous New Bag/Given 09/08/15 2210)  metroNIDAZOLE (FLAGYL) IVPB 500 mg (not administered)  ondansetron (ZOFRAN-ODT) disintegrating tablet 4 mg (4 mg Oral Given 09/08/15 1330)  sodium chloride 0.9 % bolus 1,000 mL (0 mLs Intravenous Stopped 09/08/15 2154)  HYDROmorphone (DILAUDID) injection 1 mg (1 mg Intravenous Given 09/08/15 2007)  ondansetron (ZOFRAN) injection 4 mg (4 mg Intravenous Given 09/08/15 2007)     Initial Impression / Assessment and Plan / ED Course  I have reviewed the triage vital signs and the nursing notes.  Pertinent labs & imaging results that were available during my care of the patient were reviewed by me and considered in my medical decision making (see chart for details).  Clinical Course    Patient with diagnosis of colitis. Patient nontoxic with normal white blood count. Patient will be sent home with Cipro and Flagyl will follow-up with her doctor  Final Clinical Impressions(s) / ED Diagnoses   Final diagnoses:  Colitis    New Prescriptions New Prescriptions    CIPROFLOXACIN (CIPRO) 500 MG TABLET    Take 1 tablet (500 mg total) by mouth 2 (two) times daily.   METRONIDAZOLE (FLAGYL) 500 MG TABLET    Take 1 tablet (500 mg total) by mouth 4 (four) times daily.   ONDANSETRON (ZOFRAN ODT) 4 MG DISINTEGRATING TABLET    4mg  ODT q4 hours prn nausea/vomit   TRAMADOL (ULTRAM) 50 MG TABLET    Take 1 tablet (50 mg total) by mouth every 6 (six) hours as needed.     , MD 09/08/15 2256

## 2015-09-08 NOTE — ED Triage Notes (Signed)
Pt reports she was at the grocery store and had sudden onset of abd pain. Pt endorses nausea but denies vomiting. Pt reports hx of same and reports it was caused by colitis. Pt grimacing in pain.

## 2015-09-09 NOTE — ED Notes (Signed)
Patient left at this time with all belongings. 

## 2015-09-13 MED ORDER — IOPAMIDOL (ISOVUE-300) INJECTION 61%
100.0000 mL | Freq: Once | INTRAVENOUS | Status: AC | PRN
  Administered 2015-09-08: 100 mL via INTRAVENOUS

## 2015-09-15 ENCOUNTER — Other Ambulatory Visit: Payer: Self-pay | Admitting: Family Medicine

## 2015-09-15 ENCOUNTER — Ambulatory Visit
Admission: RE | Admit: 2015-09-15 | Discharge: 2015-09-15 | Disposition: A | Discharge status: Home / Self Care | Payer: Medicare HMO | Source: Ambulatory Visit | Attending: Family Medicine | Admitting: Family Medicine

## 2015-09-15 DIAGNOSIS — K529 Noninfective gastroenteritis and colitis, unspecified: Secondary | ICD-10-CM

## 2015-09-15 MED ORDER — IOPAMIDOL (ISOVUE-300) INJECTION 61%
100.0000 mL | Freq: Once | INTRAVENOUS | Status: AC | PRN
  Administered 2015-09-15: 100 mL via INTRAVENOUS

## 2016-01-16 DIAGNOSIS — E663 Overweight: Secondary | ICD-10-CM | POA: Diagnosis not present

## 2016-01-16 DIAGNOSIS — M5136 Other intervertebral disc degeneration, lumbar region: Secondary | ICD-10-CM | POA: Diagnosis not present

## 2016-01-16 DIAGNOSIS — M0579 Rheumatoid arthritis with rheumatoid factor of multiple sites without organ or systems involvement: Secondary | ICD-10-CM | POA: Diagnosis not present

## 2016-01-16 DIAGNOSIS — Z6827 Body mass index (BMI) 27.0-27.9, adult: Secondary | ICD-10-CM | POA: Diagnosis not present

## 2016-01-16 DIAGNOSIS — Z79899 Other long term (current) drug therapy: Secondary | ICD-10-CM | POA: Diagnosis not present

## 2016-01-16 DIAGNOSIS — M2550 Pain in unspecified joint: Secondary | ICD-10-CM | POA: Diagnosis not present

## 2016-02-12 ENCOUNTER — Emergency Department (HOSPITAL_COMMUNITY): Payer: Medicare HMO

## 2016-02-12 ENCOUNTER — Encounter (HOSPITAL_COMMUNITY): Payer: Self-pay | Admitting: *Deleted

## 2016-02-12 ENCOUNTER — Emergency Department (HOSPITAL_COMMUNITY)
Admission: EM | Admit: 2016-02-12 | Discharge: 2016-02-12 | Disposition: A | Discharge status: Home / Self Care | Payer: Medicare HMO | Attending: Emergency Medicine | Admitting: Emergency Medicine

## 2016-02-12 DIAGNOSIS — R1030 Lower abdominal pain, unspecified: Secondary | ICD-10-CM | POA: Insufficient documentation

## 2016-02-12 DIAGNOSIS — Z7982 Long term (current) use of aspirin: Secondary | ICD-10-CM | POA: Diagnosis not present

## 2016-02-12 DIAGNOSIS — Z79899 Other long term (current) drug therapy: Secondary | ICD-10-CM | POA: Diagnosis not present

## 2016-02-12 DIAGNOSIS — I10 Essential (primary) hypertension: Secondary | ICD-10-CM | POA: Diagnosis not present

## 2016-02-12 DIAGNOSIS — R109 Unspecified abdominal pain: Secondary | ICD-10-CM | POA: Diagnosis not present

## 2016-02-12 LAB — URINALYSIS, ROUTINE W REFLEX MICROSCOPIC
Bilirubin Urine: NEGATIVE
GLUCOSE, UA: NEGATIVE mg/dL
KETONES UR: NEGATIVE mg/dL
Nitrite: NEGATIVE
PROTEIN: NEGATIVE mg/dL
Specific Gravity, Urine: 1.01 (ref 1.005–1.030)
pH: 7 (ref 5.0–8.0)

## 2016-02-12 LAB — COMPREHENSIVE METABOLIC PANEL
ALK PHOS: 83 U/L (ref 38–126)
ALT: 16 U/L (ref 14–54)
AST: 23 U/L (ref 15–41)
Albumin: 4.1 g/dL (ref 3.5–5.0)
Anion gap: 10 (ref 5–15)
BILIRUBIN TOTAL: 0.5 mg/dL (ref 0.3–1.2)
BUN: 16 mg/dL (ref 6–20)
CALCIUM: 9.8 mg/dL (ref 8.9–10.3)
CO2: 27 mmol/L (ref 22–32)
CREATININE: 1.08 mg/dL — AB (ref 0.44–1.00)
Chloride: 104 mmol/L (ref 101–111)
GFR, EST NON AFRICAN AMERICAN: 56 mL/min — AB (ref >60)
Glucose, Bld: 145 mg/dL — ABNORMAL HIGH (ref 65–99)
Potassium: 3.8 mmol/L (ref 3.5–5.1)
Sodium: 141 mmol/L (ref 135–145)
TOTAL PROTEIN: 7.6 g/dL (ref 6.5–8.1)

## 2016-02-12 LAB — CBC
HCT: 36.8 % (ref 36.0–46.0)
Hemoglobin: 12.3 g/dL (ref 12.0–15.0)
MCH: 28.7 pg (ref 26.0–34.0)
MCHC: 33.4 g/dL (ref 30.0–36.0)
MCV: 85.8 fL (ref 78.0–100.0)
PLATELETS: 227 10*3/uL (ref 150–400)
RBC: 4.29 MIL/uL (ref 3.87–5.11)
RDW: 13 % (ref 11.5–15.5)
WBC: 9 10*3/uL (ref 4.0–10.5)

## 2016-02-12 LAB — URINALYSIS, MICROSCOPIC (REFLEX)
BACTERIA UA: NONE SEEN
SQUAMOUS EPITHELIAL / LPF: NONE SEEN

## 2016-02-12 LAB — LIPASE, BLOOD: LIPASE: 29 U/L (ref 11–51)

## 2016-02-12 MED ORDER — HYDROCODONE-ACETAMINOPHEN 5-325 MG PO TABS
1.0000 | ORAL_TABLET | Freq: Once | ORAL | Status: AC
  Administered 2016-02-12: 1 via ORAL
  Filled 2016-02-12: qty 1

## 2016-02-12 MED ORDER — DICYCLOMINE HCL 10 MG/ML IM SOLN
20.0000 mg | Freq: Once | INTRAMUSCULAR | Status: AC
  Administered 2016-02-12: 20 mg via INTRAMUSCULAR
  Filled 2016-02-12: qty 2

## 2016-02-12 MED ORDER — IOPAMIDOL (ISOVUE-300) INJECTION 61%
INTRAVENOUS | Status: AC
  Administered 2016-02-12: 100 mL
  Filled 2016-02-12: qty 100

## 2016-02-12 MED ORDER — ONDANSETRON HCL 4 MG/2ML IJ SOLN
4.0000 mg | Freq: Once | INTRAMUSCULAR | Status: AC
  Administered 2016-02-12: 4 mg via INTRAVENOUS
  Filled 2016-02-12: qty 2

## 2016-02-12 MED ORDER — HYDROMORPHONE HCL 2 MG/ML IJ SOLN
1.0000 mg | Freq: Once | INTRAMUSCULAR | Status: AC
  Administered 2016-02-12: 1 mg via INTRAVENOUS
  Filled 2016-02-12: qty 1

## 2016-02-12 MED ORDER — OXYCODONE-ACETAMINOPHEN 5-325 MG PO TABS: 1.0000 | ORAL_TABLET | Freq: Four times a day (QID) | ORAL | 0 refills | Status: DC | PRN

## 2016-02-12 MED ORDER — DICYCLOMINE HCL 20 MG PO TABS: 20.0000 mg | ORAL_TABLET | Freq: Two times a day (BID) | ORAL | 0 refills | Status: DC

## 2016-02-12 MED ORDER — ONDANSETRON 4 MG PO TBDP
4.0000 mg | ORAL_TABLET | Freq: Once | ORAL | Status: AC | PRN
  Administered 2016-02-12: 4 mg via ORAL

## 2016-02-12 MED ORDER — ONDANSETRON HCL 4 MG PO TABS: 4.0000 mg | ORAL_TABLET | Freq: Four times a day (QID) | ORAL | 0 refills | Status: DC

## 2016-02-12 MED ORDER — ONDANSETRON 4 MG PO TBDP
ORAL_TABLET | ORAL | Status: AC
  Filled 2016-02-12: qty 1

## 2016-02-12 MED ORDER — SODIUM CHLORIDE 0.9 % IV BOLUS (SEPSIS)
1000.0000 mL | Freq: Once | INTRAVENOUS | Status: AC
  Administered 2016-02-12: 1000 mL via INTRAVENOUS

## 2016-02-12 MED ORDER — OXYCODONE-ACETAMINOPHEN 5-325 MG PO TABS
1.0000 | ORAL_TABLET | Freq: Once | ORAL | Status: AC
  Administered 2016-02-12: 1 via ORAL
  Filled 2016-02-12: qty 1

## 2016-02-12 NOTE — ED Notes (Signed)
Pt assisted off bedpan, unsuccessful attempt at urine sample.

## 2016-02-12 NOTE — ED Triage Notes (Addendum)
Pt here with lower abd pain, n/v since 0700 this morning. Has hx of colitis and it feels the same. For past few days has been nauseated. Pt actively vomiting, restless in w/c

## 2016-02-12 NOTE — ED Triage Notes (Addendum)
Previous note documented in error 

## 2016-02-12 NOTE — ED Notes (Signed)
Patient transported to CT 

## 2016-02-12 NOTE — ED Notes (Signed)
Pt given saltine crackers and ginger ale 

## 2016-02-12 NOTE — ED Notes (Signed)
Pt aroused to provide urine sample. Assisted with bedpan and bedside cleaning. Pt O2 sats 100% while awake.

## 2016-02-12 NOTE — ED Provider Notes (Signed)
[facility]-EMERGENCY DEPT Provider Note   CSN: [id number] Arrival date & time: 02/12/16  0930     History   Chief Complaint Chief Complaint  Patient presents with  . Abdominal Pain  . Emesis    HPI Takela Varden is a 56 y.o. female.  Patient is a 56 year old female with a history of colitis, rheumatoid arthritis, hypertension presenting today with severe abdominal pain that started around 7 AM this morning and woke her up. For the last few days she has been nauseated but denies any vomiting. She has felt the need to have diarrhea but has not had any. Abdomen the pain started this morning. She did have an episode of vomiting but the pain has not improved. She denies any fever, upper respiratory symptoms or chest pain. She is not taking anything for nausea with the pain.   The history is provided by the patient.  Abdominal Pain   This is a recurrent problem. The current episode started 3 to 5 hours ago. The problem occurs constantly. The problem has not changed since onset.The pain is associated with an unknown factor. The pain is located in the suprapubic region. The quality of the pain is cramping, colicky and shooting. The pain is at a severity of 10/10. The pain is severe. Associated symptoms include anorexia, nausea and vomiting. Pertinent negatives include fever and diarrhea. Nothing aggravates the symptoms. Nothing relieves the symptoms. Past medical history comments: hx of colitis.  Emesis   Associated symptoms include abdominal pain. Pertinent negatives include no diarrhea and no fever.    Past Medical History:  Diagnosis Date  . Colitis   . GERD (gastroesophageal reflux disease)   . Hypertension   . RA (rheumatoid arthritis) [facility])     Patient Active Problem List   Diagnosis Date Noted  . Ischemic colitis (HCC)   . Melena   . Rectal bleeding   . Acute ischemic colitis (HCC) 08/17/2014  . Benign essential HTN 08/17/2014  . RA (rheumatoid arthritis) (HCC) 08/17/2014  .  GERD (gastroesophageal reflux disease) 08/17/2014    History reviewed. No pertinent surgical history.  OB History    No data available       Home Medications    Prior to Admission medications   Medication Sig Start Date End Date Taking? Authorizing Provider  amitriptyline (ELAVIL) 50 MG tablet Take 50 mg by mouth daily. 07/05/14   Historical Provider, MD  aspirin EC 81 MG tablet Take 81 mg by mouth daily.    Historical Provider, MD  Butalbital-APAP-Caffeine 50-300-40 MG CAPS Take 1 tablet by mouth every 8 (eight) hours as needed (for pain).  08/16/14   Historical Provider, MD  CALCIUM PO Take 1 tablet by mouth daily.    Historical Provider, MD  ciprofloxacin (CIPRO) 500 MG tablet Take 1 tablet (500 mg total) by mouth 2 (two) times daily. 09/08/15   Bethann Berkshire, MD  folic acid (FOLVITE) 1 MG tablet Take 1 mg by mouth daily. 07/19/14   Historical Provider, MD  hydroxychloroquine (PLAQUENIL) 200 MG tablet Take 400 mg by mouth daily. 08/09/14   Historical Provider, MD  lisinopril-hydrochlorothiazide (PRINZIDE,ZESTORETIC) 10-12.5 MG per tablet Take 1 tablet by mouth daily. 07/08/14   Historical Provider, MD  metroNIDAZOLE (FLAGYL) 500 MG tablet Take 1 tablet (500 mg total) by mouth 4 (four) times daily. 09/08/15   Bethann Berkshire, MD  Omega-3 Fatty Acids (FISH OIL PO) Take 1 capsule by mouth daily.    Historical Provider, MD  ondansetron (ZOFRAN ODT) 4 MG  disintegrating tablet 4mg  ODT q4 hours prn nausea/vomit 09/08/15   09/10/15, MD  oxyCODONE-acetaminophen (PERCOCET/ROXICET) 5-325 MG per tablet Take 1-2 tablets by mouth every 6 (six) hours as needed for severe pain. Patient not taking: Reported on 08/17/2014 08/17/14   10/17/14, MD  pantoprazole (PROTONIX) 40 MG tablet Take 1 tablet (40 mg total) by mouth daily. 08/21/14   10/21/14, MD  traMADol (ULTRAM) 50 MG tablet Take 1 tablet (50 mg total) by mouth every 6 (six) hours as needed. 09/08/15   09/10/15, MD    Family History No  family history on file.  Social History Social History  Substance Use Topics  . Smoking status: Never Smoker  . Smokeless tobacco: Never Used  . Alcohol use No     Allergies   Patient has no known allergies.   Review of Systems Review of Systems  Constitutional: Negative for fever.  Gastrointestinal: Positive for abdominal pain, anorexia, nausea and vomiting. Negative for diarrhea.  All other systems reviewed and are negative.    Physical Exam Updated Vital Signs BP 139/97   Pulse 80   Temp 98.4 F (36.9 C) (Oral)   Resp 24   SpO2 100%   Physical Exam  Constitutional: She is oriented to person, place, and time. She appears well-developed and well-nourished. She appears distressed.  Appears uncomfortable moaning and rocking in bed  HENT:  Head: Normocephalic and atraumatic.  Mouth/Throat: Oropharynx is clear and moist.  Eyes: Conjunctivae and EOM are normal. Pupils are equal, round, and reactive to light.  Neck: Normal range of motion. Neck supple.  Cardiovascular: Normal rate, regular rhythm and intact distal pulses.   No murmur heard. Pulmonary/Chest: Effort normal and breath sounds normal. No respiratory distress. She has no wheezes. She has no rales.  Abdominal: Soft. She exhibits no distension. There is tenderness in the suprapubic area. There is guarding. There is no rebound. No hernia.  Musculoskeletal: Normal range of motion. She exhibits no edema or tenderness.  Neurological: She is alert and oriented to person, place, and time.  Skin: Skin is warm and dry. No rash noted. No erythema.  Psychiatric: She has a normal mood and affect. Her behavior is normal.  Nursing note and vitals reviewed.    ED Treatments / Results  Labs (all labs ordered are listed, but only abnormal results are displayed) Labs Reviewed  COMPREHENSIVE METABOLIC PANEL - Abnormal; Notable for the following:       Result Value   Glucose, Bld 145 (*)    Creatinine, Ser 1.08 (*)    GFR  calc non Af Amer 56 (*)    All other components within normal limits  URINALYSIS, ROUTINE W REFLEX MICROSCOPIC - Abnormal; Notable for the following:    Hgb urine dipstick TRACE (*)    Leukocytes, UA SMALL (*)    All other components within normal limits  LIPASE, BLOOD  CBC  URINALYSIS, MICROSCOPIC (REFLEX)    EKG  EKG Interpretation None       Radiology Ct Abdomen Pelvis W Contrast  Result Date: 02/12/2016 CLINICAL DATA:  Acute lower abdominal pain. EXAM: CT ABDOMEN AND PELVIS WITH CONTRAST TECHNIQUE: Multidetector CT imaging of the abdomen and pelvis was performed using the standard protocol following bolus administration of intravenous contrast. CONTRAST:  02/14/2016 ISOVUE-300 IOPAMIDOL (ISOVUE-300) INJECTION 61% COMPARISON:  CT scan of September 15, 2015. FINDINGS: Lower chest: No acute abnormality. Hepatobiliary: Stable right hepatic hemangioma is noted. No cholelithiasis is noted. Pancreas: Unremarkable. No pancreatic  ductal dilatation or surrounding inflammatory changes. Spleen: Normal in size without focal abnormality. Adrenals/Urinary Tract: Adrenal glands are unremarkable. Kidneys are normal, without renal calculi, focal lesion, or hydronephrosis. Bladder is unremarkable. Stomach/Bowel: Stomach is within normal limits. Appendix appears normal. No evidence of bowel wall thickening, distention, or inflammatory changes. Vascular/Lymphatic: Aortic atherosclerosis. No enlarged abdominal or pelvic lymph nodes. Reproductive: Uterus and bilateral adnexa are unremarkable. Other: No abdominal wall hernia or abnormality. No abdominopelvic ascites. Musculoskeletal: No acute or significant osseous findings. IMPRESSION: Stable right hepatic hemangioma. Aortic atherosclerosis. No other abnormality seen in the abdomen or pelvis. Electronically Signed   By: Lupita Raider, M.D.   On: 02/12/2016 13:13    Procedures Procedures (including critical care time)  Medications Ordered in ED Medications    sodium chloride 0.9 % bolus 1,000 mL (1,000 mLs Intravenous New Bag/Given 02/12/16 1133)  ondansetron (ZOFRAN-ODT) disintegrating tablet 4 mg (4 mg Oral Given 02/12/16 1028)  HYDROmorphone (DILAUDID) injection 1 mg (1 mg Intravenous Given 02/12/16 1133)  ondansetron (ZOFRAN) injection 4 mg (4 mg Intravenous Given 02/12/16 1133)     Initial Impression / Assessment and Plan / ED Course  I have reviewed the triage vital signs and the nursing notes.  Pertinent labs & imaging results that were available during my care of the patient were reviewed by me and considered in my medical decision making (see chart for details).    Patient is a 56 year old female presenting today with severe lower abdominal pain. She has not felt well over the last few days but the pain did not become severe until 7 AM this morning. She has had one episode of emesis but no radiation of the pain. She denies any urinary or vaginal symptoms. She does have a prior history of ischemic colitis and states this feels similar. She has not had any diarrhea. No bloody stools or blood in her emesis. She is not an alcohol user. Patient had labs done well in the waiting room which are unremarkable. Normal lipase, CMP and CBC. Patient given IV fluids and pain control. Will do a CT for further evaluation. Urine is pending.  3:09 PM Imaging and UA are wnl.  Unclear why pt is having pain.  On re-evaluation pt initially looked comfortable but when I went in the room she states pain has worsened.  Will give bentyl and vicodin.  Will encourage pt to eat and d/c home with f/u.  4:52 PM Pt holding down fluids.  No vomiting still appears to be in pain pointing to the lower abd radiating to the sides.  Unclear cause.  No urinary concerns.  No retention.  No signs of AAA or ischemic colitis today.  Will d/c home with pain meds.  Will have f/u with [facility].  Final Clinical Impressions(s) / ED Diagnoses   Final diagnoses:  Lower abdominal pain     New Prescriptions New Prescriptions   DICYCLOMINE (BENTYL) 20 MG TABLET    Take 1 tablet (20 mg total) by mouth 2 (two) times daily.   ONDANSETRON (ZOFRAN) 4 MG TABLET    Take 1 tablet (4 mg total) by mouth every 6 (six) hours.   OXYCODONE-ACETAMINOPHEN (PERCOCET/ROXICET) 5-325 MG TABLET    Take 1-2 tablets by mouth every 6 (six) hours as needed for severe pain.     Gwyneth Sprout, MD 02/12/16 [phone]

## 2016-02-12 NOTE — ED Notes (Signed)
ED Provider at bedside. 

## 2016-02-12 NOTE — ED Notes (Signed)
Pt O2 noted to be 86-88% while sleeping on RA. Pt placed on 2L [place] while sleeping, O2 93%.

## 2016-02-12 NOTE — ED Notes (Signed)
Pt able to eat two saltine crackers and have a few sips of gingerale without N/V. Dr. Anitra Lauth aware.

## 2016-02-12 NOTE — Discharge Instructions (Signed)
Return if she develops altered mental status, temperature greater than 100.4, shortness of breath, movement of the pain, persistent vomiting or bloody diarrhea

## 2016-02-12 NOTE — ED Notes (Signed)
Pt ambulatory to restroom with steady gait. Dr. Anitra Lauth aware.

## 2016-02-16 DIAGNOSIS — R1030 Lower abdominal pain, unspecified: Secondary | ICD-10-CM | POA: Diagnosis not present

## 2016-06-27 DIAGNOSIS — J069 Acute upper respiratory infection, unspecified: Secondary | ICD-10-CM | POA: Diagnosis not present

## 2016-07-15 DIAGNOSIS — E669 Obesity, unspecified: Secondary | ICD-10-CM | POA: Diagnosis not present

## 2016-07-15 DIAGNOSIS — M0579 Rheumatoid arthritis with rheumatoid factor of multiple sites without organ or systems involvement: Secondary | ICD-10-CM | POA: Diagnosis not present

## 2016-07-15 DIAGNOSIS — M2550 Pain in unspecified joint: Secondary | ICD-10-CM | POA: Diagnosis not present

## 2016-07-15 DIAGNOSIS — M5136 Other intervertebral disc degeneration, lumbar region: Secondary | ICD-10-CM | POA: Diagnosis not present

## 2016-07-15 DIAGNOSIS — Z6827 Body mass index (BMI) 27.0-27.9, adult: Secondary | ICD-10-CM | POA: Diagnosis not present

## 2016-07-15 DIAGNOSIS — Z79899 Other long term (current) drug therapy: Secondary | ICD-10-CM | POA: Diagnosis not present

## 2016-08-05 ENCOUNTER — Other Ambulatory Visit: Payer: Self-pay | Admitting: Family Medicine

## 2016-08-05 DIAGNOSIS — Z1231 Encounter for screening mammogram for malignant neoplasm of breast: Secondary | ICD-10-CM

## 2016-09-04 ENCOUNTER — Ambulatory Visit
Admission: RE | Admit: 2016-09-04 | Discharge: 2016-09-04 | Disposition: A | Discharge status: Home / Self Care | Payer: Medicare HMO | Source: Ambulatory Visit | Attending: Family Medicine | Admitting: Family Medicine

## 2016-09-04 DIAGNOSIS — Z1231 Encounter for screening mammogram for malignant neoplasm of breast: Secondary | ICD-10-CM

## 2016-09-12 DIAGNOSIS — R7301 Impaired fasting glucose: Secondary | ICD-10-CM | POA: Diagnosis not present

## 2016-09-12 DIAGNOSIS — H532 Diplopia: Secondary | ICD-10-CM | POA: Diagnosis not present

## 2016-09-12 DIAGNOSIS — Z23 Encounter for immunization: Secondary | ICD-10-CM | POA: Diagnosis not present

## 2016-09-13 DIAGNOSIS — H532 Diplopia: Secondary | ICD-10-CM | POA: Diagnosis not present

## 2016-09-20 DIAGNOSIS — H04123 Dry eye syndrome of bilateral lacrimal glands: Secondary | ICD-10-CM | POA: Diagnosis not present

## 2016-09-20 DIAGNOSIS — H2513 Age-related nuclear cataract, bilateral: Secondary | ICD-10-CM | POA: Diagnosis not present

## 2016-09-20 DIAGNOSIS — H5051 Esophoria: Secondary | ICD-10-CM | POA: Diagnosis not present

## 2016-10-22 DIAGNOSIS — H35419 Lattice degeneration of retina, unspecified eye: Secondary | ICD-10-CM | POA: Diagnosis not present

## 2016-10-22 DIAGNOSIS — H04123 Dry eye syndrome of bilateral lacrimal glands: Secondary | ICD-10-CM | POA: Diagnosis not present

## 2016-10-22 DIAGNOSIS — Z79899 Other long term (current) drug therapy: Secondary | ICD-10-CM | POA: Diagnosis not present

## 2016-10-22 DIAGNOSIS — M069 Rheumatoid arthritis, unspecified: Secondary | ICD-10-CM | POA: Diagnosis not present

## 2017-01-15 DIAGNOSIS — Z6827 Body mass index (BMI) 27.0-27.9, adult: Secondary | ICD-10-CM | POA: Diagnosis not present

## 2017-01-15 DIAGNOSIS — Z79899 Other long term (current) drug therapy: Secondary | ICD-10-CM | POA: Diagnosis not present

## 2017-01-15 DIAGNOSIS — M5136 Other intervertebral disc degeneration, lumbar region: Secondary | ICD-10-CM | POA: Diagnosis not present

## 2017-01-15 DIAGNOSIS — M2550 Pain in unspecified joint: Secondary | ICD-10-CM | POA: Diagnosis not present

## 2017-01-15 DIAGNOSIS — E669 Obesity, unspecified: Secondary | ICD-10-CM | POA: Diagnosis not present

## 2017-01-15 DIAGNOSIS — M0579 Rheumatoid arthritis with rheumatoid factor of multiple sites without organ or systems involvement: Secondary | ICD-10-CM | POA: Diagnosis not present

## 2017-09-04 ENCOUNTER — Other Ambulatory Visit: Payer: Self-pay | Admitting: Family Medicine

## 2017-09-04 DIAGNOSIS — Z1231 Encounter for screening mammogram for malignant neoplasm of breast: Secondary | ICD-10-CM

## 2017-10-03 ENCOUNTER — Ambulatory Visit
Admission: RE | Admit: 2017-10-03 | Discharge: 2017-10-03 | Disposition: A | Discharge status: Home / Self Care | Payer: Medicare Other | Source: Ambulatory Visit | Attending: Family Medicine | Admitting: Family Medicine

## 2017-10-03 DIAGNOSIS — Z1231 Encounter for screening mammogram for malignant neoplasm of breast: Secondary | ICD-10-CM

## 2017-10-07 ENCOUNTER — Other Ambulatory Visit: Payer: Self-pay | Admitting: Family Medicine

## 2017-10-07 DIAGNOSIS — R928 Other abnormal and inconclusive findings on diagnostic imaging of breast: Secondary | ICD-10-CM

## 2017-10-09 ENCOUNTER — Ambulatory Visit
Admission: RE | Admit: 2017-10-09 | Discharge: 2017-10-09 | Disposition: A | Discharge status: Home / Self Care | Payer: Medicare Other | Source: Ambulatory Visit | Attending: Family Medicine | Admitting: Family Medicine

## 2017-10-09 DIAGNOSIS — R928 Other abnormal and inconclusive findings on diagnostic imaging of breast: Secondary | ICD-10-CM

## 2017-10-24 ENCOUNTER — Other Ambulatory Visit (HOSPITAL_COMMUNITY)
Admission: RE | Admit: 2017-10-24 | Discharge: 2017-10-24 | Disposition: A | Discharge status: Home / Self Care | Payer: Medicare Other | Source: Ambulatory Visit | Attending: Family Medicine | Admitting: Family Medicine

## 2017-10-24 ENCOUNTER — Other Ambulatory Visit: Payer: Self-pay | Admitting: Family Medicine

## 2017-10-24 DIAGNOSIS — Z01411 Encounter for gynecological examination (general) (routine) with abnormal findings: Secondary | ICD-10-CM | POA: Insufficient documentation

## 2017-10-27 LAB — CYTOLOGY - PAP: Diagnosis: NEGATIVE

## 2018-09-17 ENCOUNTER — Other Ambulatory Visit: Payer: Self-pay | Admitting: Family Medicine

## 2018-09-17 DIAGNOSIS — Z1231 Encounter for screening mammogram for malignant neoplasm of breast: Secondary | ICD-10-CM

## 2018-10-22 ENCOUNTER — Other Ambulatory Visit: Payer: Self-pay

## 2018-10-22 ENCOUNTER — Ambulatory Visit
Admission: RE | Admit: 2018-10-22 | Discharge: 2018-10-22 | Disposition: A | Discharge status: Home / Self Care | Payer: Medicare Other | Source: Ambulatory Visit | Attending: Family Medicine | Admitting: Family Medicine

## 2018-10-22 DIAGNOSIS — Z1231 Encounter for screening mammogram for malignant neoplasm of breast: Secondary | ICD-10-CM

## 2018-10-29 ENCOUNTER — Ambulatory Visit: Payer: Medicare Other

## 2019-09-27 ENCOUNTER — Other Ambulatory Visit: Payer: Self-pay | Admitting: Family Medicine

## 2019-09-27 DIAGNOSIS — Z1231 Encounter for screening mammogram for malignant neoplasm of breast: Secondary | ICD-10-CM

## 2019-10-25 ENCOUNTER — Ambulatory Visit
Admission: RE | Admit: 2019-10-25 | Discharge: 2019-10-25 | Disposition: A | Discharge status: Home / Self Care | Payer: Medicare Other | Source: Ambulatory Visit | Attending: Family Medicine | Admitting: Family Medicine

## 2019-10-25 ENCOUNTER — Other Ambulatory Visit: Payer: Self-pay

## 2019-10-25 DIAGNOSIS — Z1231 Encounter for screening mammogram for malignant neoplasm of breast: Secondary | ICD-10-CM

## 2019-10-29 ENCOUNTER — Other Ambulatory Visit: Payer: Self-pay | Admitting: Family Medicine

## 2019-10-29 DIAGNOSIS — R928 Other abnormal and inconclusive findings on diagnostic imaging of breast: Secondary | ICD-10-CM

## 2019-11-10 ENCOUNTER — Other Ambulatory Visit: Payer: Self-pay

## 2019-11-10 ENCOUNTER — Ambulatory Visit
Admission: RE | Admit: 2019-11-10 | Discharge: 2019-11-10 | Disposition: A | Discharge status: Home / Self Care | Payer: Medicare Other | Source: Ambulatory Visit | Attending: Family Medicine | Admitting: Family Medicine

## 2019-11-10 DIAGNOSIS — R928 Other abnormal and inconclusive findings on diagnostic imaging of breast: Secondary | ICD-10-CM

## 2020-01-23 DIAGNOSIS — Z1152 Encounter for screening for COVID-19: Secondary | ICD-10-CM | POA: Diagnosis not present

## 2020-01-28 DIAGNOSIS — Z1152 Encounter for screening for COVID-19: Secondary | ICD-10-CM | POA: Diagnosis not present

## 2020-04-21 DIAGNOSIS — L84 Corns and callosities: Secondary | ICD-10-CM | POA: Diagnosis not present

## 2020-04-21 DIAGNOSIS — M722 Plantar fascial fibromatosis: Secondary | ICD-10-CM | POA: Diagnosis not present

## 2020-06-07 DIAGNOSIS — M5136 Other intervertebral disc degeneration, lumbar region: Secondary | ICD-10-CM | POA: Diagnosis not present

## 2020-06-07 DIAGNOSIS — Z79899 Other long term (current) drug therapy: Secondary | ICD-10-CM | POA: Diagnosis not present

## 2020-06-07 DIAGNOSIS — M2550 Pain in unspecified joint: Secondary | ICD-10-CM | POA: Diagnosis not present

## 2020-06-07 DIAGNOSIS — M0579 Rheumatoid arthritis with rheumatoid factor of multiple sites without organ or systems involvement: Secondary | ICD-10-CM | POA: Diagnosis not present

## 2020-08-21 ENCOUNTER — Inpatient Hospital Stay (HOSPITAL_COMMUNITY)
Admission: EM | Admit: 2020-08-21 | Discharge: 2020-08-24 | DRG: 387 | Disposition: A | Discharge status: Home / Self Care | Payer: Medicare Other | Attending: Internal Medicine | Admitting: Internal Medicine

## 2020-08-21 ENCOUNTER — Other Ambulatory Visit: Payer: Self-pay

## 2020-08-21 DIAGNOSIS — K529 Noninfective gastroenteritis and colitis, unspecified: Secondary | ICD-10-CM | POA: Diagnosis present

## 2020-08-21 DIAGNOSIS — N289 Disorder of kidney and ureter, unspecified: Secondary | ICD-10-CM

## 2020-08-21 DIAGNOSIS — I10 Essential (primary) hypertension: Secondary | ICD-10-CM | POA: Diagnosis not present

## 2020-08-21 DIAGNOSIS — K219 Gastro-esophageal reflux disease without esophagitis: Secondary | ICD-10-CM | POA: Diagnosis present

## 2020-08-21 DIAGNOSIS — Z20822 Contact with and (suspected) exposure to covid-19: Secondary | ICD-10-CM | POA: Diagnosis not present

## 2020-08-21 DIAGNOSIS — I129 Hypertensive chronic kidney disease with stage 1 through stage 4 chronic kidney disease, or unspecified chronic kidney disease: Secondary | ICD-10-CM | POA: Diagnosis not present

## 2020-08-21 DIAGNOSIS — Z79899 Other long term (current) drug therapy: Secondary | ICD-10-CM | POA: Diagnosis not present

## 2020-08-21 DIAGNOSIS — N1831 Chronic kidney disease, stage 3a: Secondary | ICD-10-CM | POA: Diagnosis present

## 2020-08-21 DIAGNOSIS — K5010 Crohn's disease of large intestine without complications: Secondary | ICD-10-CM | POA: Diagnosis not present

## 2020-08-21 DIAGNOSIS — M069 Rheumatoid arthritis, unspecified: Secondary | ICD-10-CM | POA: Diagnosis not present

## 2020-08-21 DIAGNOSIS — E876 Hypokalemia: Secondary | ICD-10-CM | POA: Diagnosis not present

## 2020-08-21 DIAGNOSIS — Z8719 Personal history of other diseases of the digestive system: Secondary | ICD-10-CM

## 2020-08-21 DIAGNOSIS — Z7982 Long term (current) use of aspirin: Secondary | ICD-10-CM | POA: Diagnosis not present

## 2020-08-21 DIAGNOSIS — G43909 Migraine, unspecified, not intractable, without status migrainosus: Secondary | ICD-10-CM | POA: Diagnosis present

## 2020-08-21 DIAGNOSIS — R109 Unspecified abdominal pain: Secondary | ICD-10-CM | POA: Diagnosis not present

## 2020-08-21 DIAGNOSIS — G43809 Other migraine, not intractable, without status migrainosus: Secondary | ICD-10-CM | POA: Diagnosis not present

## 2020-08-21 LAB — CBC WITH DIFFERENTIAL/PLATELET
Abs Immature Granulocytes: 0.03 10*3/uL (ref 0.00–0.07)
Basophils Absolute: 0 10*3/uL (ref 0.0–0.1)
Basophils Relative: 1 %
Eosinophils Absolute: 0.2 10*3/uL (ref 0.0–0.5)
Eosinophils Relative: 2 %
HCT: 38.5 % (ref 36.0–46.0)
Hemoglobin: 12.7 g/dL (ref 12.0–15.0)
Immature Granulocytes: 0 %
Lymphocytes Relative: 13 %
Lymphs Abs: 1.1 10*3/uL (ref 0.7–4.0)
MCH: 28.8 pg (ref 26.0–34.0)
MCHC: 33 g/dL (ref 30.0–36.0)
MCV: 87.3 fL (ref 80.0–100.0)
Monocytes Absolute: 0.3 10*3/uL (ref 0.1–1.0)
Monocytes Relative: 3 %
Neutro Abs: 6.6 10*3/uL (ref 1.7–7.7)
Neutrophils Relative %: 81 %
Platelets: 248 10*3/uL (ref 150–400)
RBC: 4.41 MIL/uL (ref 3.87–5.11)
RDW: 13 % (ref 11.5–15.5)
WBC: 8.2 10*3/uL (ref 4.0–10.5)
nRBC: 0 % (ref 0.0–0.2)

## 2020-08-21 LAB — URINALYSIS, ROUTINE W REFLEX MICROSCOPIC
Bilirubin Urine: NEGATIVE
Glucose, UA: NEGATIVE mg/dL
Hgb urine dipstick: NEGATIVE
Ketones, ur: NEGATIVE mg/dL
Nitrite: NEGATIVE
Protein, ur: NEGATIVE mg/dL
Specific Gravity, Urine: 1.013 (ref 1.005–1.030)
pH: 5 (ref 5.0–8.0)

## 2020-08-21 LAB — COMPREHENSIVE METABOLIC PANEL
ALT: 22 U/L (ref 0–44)
AST: 28 U/L (ref 15–41)
Albumin: 4.1 g/dL (ref 3.5–5.0)
Alkaline Phosphatase: 74 U/L (ref 38–126)
Anion gap: 10 (ref 5–15)
BUN: 9 mg/dL (ref 8–23)
CO2: 26 mmol/L (ref 22–32)
Calcium: 9.4 mg/dL (ref 8.9–10.3)
Chloride: 103 mmol/L (ref 98–111)
Creatinine, Ser: 1.27 mg/dL — ABNORMAL HIGH (ref 0.44–1.00)
GFR, Estimated: 48 mL/min — ABNORMAL LOW (ref >60)
Glucose, Bld: 123 mg/dL — ABNORMAL HIGH (ref 70–99)
Potassium: 3.3 mmol/L — ABNORMAL LOW (ref 3.5–5.1)
Sodium: 139 mmol/L (ref 135–145)
Total Bilirubin: 0.4 mg/dL (ref 0.3–1.2)
Total Protein: 7.2 g/dL (ref 6.5–8.1)

## 2020-08-21 LAB — LIPASE, BLOOD: Lipase: 33 U/L (ref 11–51)

## 2020-08-21 MED ORDER — ACETAMINOPHEN 325 MG PO TABS: 650.0000 mg | ORAL_TABLET | Freq: Once | ORAL | Status: DC

## 2020-08-21 MED ORDER — ONDANSETRON 4 MG PO TBDP: 4.0000 mg | ORAL_TABLET | Freq: Once | ORAL | Status: DC

## 2020-08-21 NOTE — ED Triage Notes (Signed)
Pt states she feels her colitis is acting up.  Nausea and pain since 1700 today.  Hx of same.

## 2020-08-21 NOTE — ED Provider Notes (Signed)
Emergency Medicine Provider Triage Evaluation Note  Michelle Tapia , a 60 y.o. female  was evaluated in triage.  Pt complains of lower abdominal pain that started around 5:00 this evening with associated nausea.  History of colitis and states this feels similar..  Review of Systems  Positive: Abdominal pain, nausea, melena Negative: Hematochezia, dysuria, urinary frequency/urgency, chest pain  Physical Exam  BP (!) 160/99 (BP Location: Left Arm)   Pulse 91   Temp 99 F (37.2 C) (Oral)   Resp 16   Ht 5\' 2"  (1.575 m)   Wt 67.6 kg   SpO2 96%   BMI 27.25 kg/m  Gen:   Awake, no distress   Resp:  Normal effort  MSK:   Moves extremities without difficulty  Other:  Abdomen soft and nondistended but generally tender worsening left lower quadrant, and suprapubic area no CVAT  Medical Decision Making  Medically screening exam initiated at 8:46 PM.  Appropriate orders placed.  Michelle Tapia was informed that the remainder of the evaluation will be completed by another provider, this initial triage assessment does not replace that evaluation, and the importance of remaining in the ED until their evaluation is complete.  This chart was dictated using voice recognition software, Dragon. Despite the best efforts of this provider to proofread and correct errors, errors may still occur which can change documentation meaning.    Michelle Tapia 08/21/20 2047    2048, MD 08/21/20 2202

## 2020-08-22 ENCOUNTER — Emergency Department (HOSPITAL_COMMUNITY): Payer: Medicare Other

## 2020-08-22 ENCOUNTER — Encounter (HOSPITAL_COMMUNITY): Payer: Self-pay | Admitting: Internal Medicine

## 2020-08-22 ENCOUNTER — Inpatient Hospital Stay (HOSPITAL_COMMUNITY): Payer: Medicare Other

## 2020-08-22 DIAGNOSIS — I10 Essential (primary) hypertension: Secondary | ICD-10-CM

## 2020-08-22 DIAGNOSIS — G43809 Other migraine, not intractable, without status migrainosus: Secondary | ICD-10-CM | POA: Diagnosis not present

## 2020-08-22 DIAGNOSIS — K5010 Crohn's disease of large intestine without complications: Secondary | ICD-10-CM | POA: Diagnosis present

## 2020-08-22 DIAGNOSIS — N1831 Chronic kidney disease, stage 3a: Secondary | ICD-10-CM | POA: Diagnosis present

## 2020-08-22 DIAGNOSIS — N289 Disorder of kidney and ureter, unspecified: Secondary | ICD-10-CM

## 2020-08-22 DIAGNOSIS — K529 Noninfective gastroenteritis and colitis, unspecified: Secondary | ICD-10-CM | POA: Diagnosis not present

## 2020-08-22 DIAGNOSIS — I517 Cardiomegaly: Secondary | ICD-10-CM | POA: Diagnosis not present

## 2020-08-22 DIAGNOSIS — G43909 Migraine, unspecified, not intractable, without status migrainosus: Secondary | ICD-10-CM | POA: Diagnosis present

## 2020-08-22 DIAGNOSIS — I129 Hypertensive chronic kidney disease with stage 1 through stage 4 chronic kidney disease, or unspecified chronic kidney disease: Secondary | ICD-10-CM | POA: Diagnosis present

## 2020-08-22 DIAGNOSIS — Z7982 Long term (current) use of aspirin: Secondary | ICD-10-CM | POA: Diagnosis not present

## 2020-08-22 DIAGNOSIS — R933 Abnormal findings on diagnostic imaging of other parts of digestive tract: Secondary | ICD-10-CM | POA: Diagnosis not present

## 2020-08-22 DIAGNOSIS — Z8719 Personal history of other diseases of the digestive system: Secondary | ICD-10-CM | POA: Diagnosis not present

## 2020-08-22 DIAGNOSIS — K219 Gastro-esophageal reflux disease without esophagitis: Secondary | ICD-10-CM | POA: Diagnosis present

## 2020-08-22 DIAGNOSIS — E876 Hypokalemia: Secondary | ICD-10-CM | POA: Diagnosis present

## 2020-08-22 DIAGNOSIS — Z20822 Contact with and (suspected) exposure to covid-19: Secondary | ICD-10-CM | POA: Diagnosis present

## 2020-08-22 DIAGNOSIS — M069 Rheumatoid arthritis, unspecified: Secondary | ICD-10-CM | POA: Diagnosis not present

## 2020-08-22 DIAGNOSIS — R109 Unspecified abdominal pain: Secondary | ICD-10-CM | POA: Diagnosis not present

## 2020-08-22 DIAGNOSIS — J9811 Atelectasis: Secondary | ICD-10-CM | POA: Diagnosis not present

## 2020-08-22 DIAGNOSIS — Z79899 Other long term (current) drug therapy: Secondary | ICD-10-CM | POA: Diagnosis not present

## 2020-08-22 LAB — SARS CORONAVIRUS 2 (TAT 6-24 HRS): SARS Coronavirus 2: NEGATIVE

## 2020-08-22 MED ORDER — ASPIRIN EC 81 MG PO TBEC
81.0000 mg | DELAYED_RELEASE_TABLET | Freq: Every day | ORAL | Status: DC
  Administered 2020-08-22 – 2020-08-24 (×3): 81 mg via ORAL
  Filled 2020-08-22 (×3): qty 1

## 2020-08-22 MED ORDER — ACETAMINOPHEN 325 MG PO TABS
650.0000 mg | ORAL_TABLET | Freq: Four times a day (QID) | ORAL | Status: DC | PRN
  Administered 2020-08-23 – 2020-08-24 (×3): 650 mg via ORAL
  Filled 2020-08-22 (×3): qty 2

## 2020-08-22 MED ORDER — ALBUTEROL SULFATE (2.5 MG/3ML) 0.083% IN NEBU: 2.5000 mg | INHALATION_SOLUTION | Freq: Four times a day (QID) | RESPIRATORY_TRACT | Status: DC

## 2020-08-22 MED ORDER — POTASSIUM CHLORIDE IN NACL 20-0.9 MEQ/L-% IV SOLN
INTRAVENOUS | Status: DC
  Filled 2020-08-22 (×2): qty 1000

## 2020-08-22 MED ORDER — ACETAMINOPHEN 650 MG RE SUPP: 650.0000 mg | Freq: Four times a day (QID) | RECTAL | Status: DC | PRN

## 2020-08-22 MED ORDER — METRONIDAZOLE 500 MG/100ML IV SOLN
500.0000 mg | Freq: Three times a day (TID) | INTRAVENOUS | Status: DC
  Administered 2020-08-22 – 2020-08-24 (×6): 500 mg via INTRAVENOUS
  Filled 2020-08-22 (×6): qty 100

## 2020-08-22 MED ORDER — ONDANSETRON HCL 4 MG PO TABS: 4.0000 mg | ORAL_TABLET | Freq: Four times a day (QID) | ORAL | Status: DC | PRN

## 2020-08-22 MED ORDER — HYDROXYCHLOROQUINE SULFATE 200 MG PO TABS
400.0000 mg | ORAL_TABLET | Freq: Every day | ORAL | Status: DC
  Administered 2020-08-22 – 2020-08-24 (×3): 400 mg via ORAL
  Filled 2020-08-22 (×4): qty 2

## 2020-08-22 MED ORDER — SODIUM CHLORIDE 0.9 % IV SOLN
2.0000 g | INTRAVENOUS | Status: DC
  Administered 2020-08-23 – 2020-08-24 (×2): 2 g via INTRAVENOUS
  Filled 2020-08-22 (×2): qty 20

## 2020-08-22 MED ORDER — MORPHINE SULFATE (PF) 4 MG/ML IV SOLN
4.0000 mg | Freq: Once | INTRAVENOUS | Status: AC
  Administered 2020-08-22: 4 mg via INTRAVENOUS
  Filled 2020-08-22: qty 1

## 2020-08-22 MED ORDER — IOHEXOL 300 MG/ML  SOLN
80.0000 mL | Freq: Once | INTRAMUSCULAR | Status: AC | PRN
  Administered 2020-08-22: 80 mL via INTRAVENOUS

## 2020-08-22 MED ORDER — SODIUM CHLORIDE 0.9% FLUSH
3.0000 mL | Freq: Two times a day (BID) | INTRAVENOUS | Status: DC
  Administered 2020-08-22 – 2020-08-24 (×4): 3 mL via INTRAVENOUS

## 2020-08-22 MED ORDER — ENOXAPARIN SODIUM 40 MG/0.4ML IJ SOSY
40.0000 mg | PREFILLED_SYRINGE | INTRAMUSCULAR | Status: DC
  Administered 2020-08-22 – 2020-08-23 (×2): 40 mg via SUBCUTANEOUS
  Filled 2020-08-22 (×2): qty 0.4

## 2020-08-22 MED ORDER — SACCHAROMYCES BOULARDII 250 MG PO CAPS
250.0000 mg | ORAL_CAPSULE | Freq: Two times a day (BID) | ORAL | Status: DC
  Administered 2020-08-22 – 2020-08-24 (×4): 250 mg via ORAL
  Filled 2020-08-22 (×4): qty 1

## 2020-08-22 MED ORDER — SODIUM CHLORIDE 0.9 % IV BOLUS
1000.0000 mL | Freq: Once | INTRAVENOUS | Status: AC
  Administered 2020-08-22: 1000 mL via INTRAVENOUS

## 2020-08-22 MED ORDER — PANTOPRAZOLE SODIUM 40 MG PO TBEC
40.0000 mg | DELAYED_RELEASE_TABLET | Freq: Every day | ORAL | Status: DC
  Administered 2020-08-22 – 2020-08-24 (×3): 40 mg via ORAL
  Filled 2020-08-22 (×3): qty 1

## 2020-08-22 MED ORDER — METRONIDAZOLE 500 MG/100ML IV SOLN
500.0000 mg | Freq: Once | INTRAVENOUS | Status: AC
  Administered 2020-08-22: 500 mg via INTRAVENOUS
  Filled 2020-08-22: qty 100

## 2020-08-22 MED ORDER — SODIUM CHLORIDE 0.9 % IV SOLN: INTRAVENOUS | Status: DC

## 2020-08-22 MED ORDER — ALBUTEROL SULFATE (2.5 MG/3ML) 0.083% IN NEBU: 2.5000 mg | INHALATION_SOLUTION | RESPIRATORY_TRACT | Status: DC | PRN

## 2020-08-22 MED ORDER — MORPHINE SULFATE (PF) 2 MG/ML IV SOLN
2.0000 mg | INTRAVENOUS | Status: DC | PRN
  Administered 2020-08-22: 2 mg via INTRAVENOUS
  Filled 2020-08-22: qty 1

## 2020-08-22 MED ORDER — SODIUM CHLORIDE 0.9 % IV SOLN
2.0000 g | Freq: Once | INTRAVENOUS | Status: AC
  Administered 2020-08-22: 2 g via INTRAVENOUS
  Filled 2020-08-22: qty 20

## 2020-08-22 MED ORDER — LABETALOL HCL 5 MG/ML IV SOLN: 10.0000 mg | INTRAVENOUS | Status: DC | PRN

## 2020-08-22 MED ORDER — ONDANSETRON HCL 4 MG/2ML IJ SOLN: 4.0000 mg | Freq: Four times a day (QID) | INTRAMUSCULAR | Status: DC | PRN

## 2020-08-22 MED ORDER — AMITRIPTYLINE HCL 25 MG PO TABS
25.0000 mg | ORAL_TABLET | Freq: Every day | ORAL | Status: DC
  Administered 2020-08-22 – 2020-08-24 (×3): 25 mg via ORAL
  Filled 2020-08-22 (×2): qty 1
  Filled 2020-08-22: qty 2

## 2020-08-22 NOTE — ED Notes (Signed)
Provided pt with water for fluid challenge. 

## 2020-08-22 NOTE — ED Notes (Addendum)
Pt ambulated to bathroom with no assistance. Pt is now back in the bed, hooked up to the monitor and comfortable.

## 2020-08-22 NOTE — H&P (Signed)
History and Physical    [name] JOA:[id number] DOB: 1960/09/12 DOA: 08/21/2020  Referring MD/NP/PA: Harlene Salts, PA-C PCP: Mila Palmer, MD  Patient coming from: Home  Chief Complaint: Lower abdominal pain  I have personally briefly reviewed patient's old medical records in [place] [facility]   HPI: Michelle Tapia is a 60 y.o. female with medical history significant of hypertension, rheumatoid arthritis, GERD, and history of ischemic colitis presents with complaints of lower abdominal pain which started yesterday evening around 5 PM yesterday.  Pain was initially noted to be sharp in nature and initially was across her whole lower abdomen.  Pain now at this time is mostly located in the lower left quadrant of her abdomen with what she describes as a continued soreness.  Noted associated symptoms of nausea, dysuria, headache, and several episodes of nonbloody diarrhea.  Denies having any vomiting, fever, chills, shortness of breath, or chest pain.  Patient had prior history of ischemic colitis with bleeding back in 2016 for which she was hospitalized.  Following that hospitalization patient was evaluated by Dr. Matthias Hughs and reportedly had a colonoscopy that was normal by her report.    ED Course: Upon admission into the emergency department patient was seen to be afebrile, pulse 91-1 13, respirations 16-23, blood pressures 118/78-160/99, and O2 saturations maintained on room air.  Labs significant for potassium 3.3, BUN 9, and creatinine 1.27.  CT scan of the abdomen and pelvis with contrast significant for moderate acute sigmoid colitis with small volume of free fluid in abdomen without signs of abscess or perforation.  Urinalysis was significant for large leukocytes, rare bacteria, and 21-50 WBCs.  Patient had been given Tylenol, 1 L normal saline IV fluids, 4 mg of morphine IV, Rocephin, and metronidazole.  Review of Systems  Constitutional:  Positive for  malaise/fatigue. Negative for fever.  HENT:  Negative for congestion and nosebleeds.   Eyes:  Negative for double vision and photophobia.  Respiratory:  Negative for cough and shortness of breath.   Cardiovascular:  Negative for chest pain and leg swelling.  Gastrointestinal:  Positive for abdominal pain, diarrhea and nausea. Negative for blood in stool and vomiting.  Genitourinary:  Positive for dysuria. Negative for frequency.  Musculoskeletal:  Negative for falls.  Neurological:  Positive for headaches. Negative for loss of consciousness.  Psychiatric/Behavioral:  Negative for memory loss and substance abuse.   Otherwise a 10 point review of systems was completed and negative except for as noted above in HPI.  Past Medical History:  Diagnosis Date   Colitis    GERD (gastroesophageal reflux disease)    Hypertension    RA (rheumatoid arthritis) (HCC)     Past Surgical History:  Procedure Laterality Date   BREAST EXCISIONAL BIOPSY Right    benign     reports that she has never smoked. She has never used smokeless tobacco. She reports that she does not drink alcohol and does not use drugs.  No Known Allergies  History reviewed. No pertinent family history.  Prior to Admission medications   Medication Sig Start Date End Date Taking? Authorizing Provider  acidophilus (RISAQUAD) CAPS capsule Take 1 capsule by mouth daily.    [provider]  aspirin EC 81 MG tablet Take 81 mg by mouth daily.    [provider]  Butalbital-APAP-Caffeine 50-300-40 MG CAPS Take 1 tablet by mouth every 8 (eight) hours as needed (for pain).  08/16/14   [provider]  CALCIUM PO Take 1 tablet  by mouth daily.    [provider]  dicyclomine (BENTYL) 20 MG tablet Take 1 tablet (20 mg total) by mouth 2 (two) times daily. 02/12/16   Gwyneth Sprout, MD  folic acid (FOLVITE) 1 MG tablet Take 1 mg by mouth daily. 07/19/14   [provider]  hydroxychloroquine  (PLAQUENIL) 200 MG tablet Take 400 mg by mouth daily. 08/09/14   [provider]  lisinopril-hydrochlorothiazide (PRINZIDE,ZESTORETIC) 10-12.5 MG per tablet Take 1 tablet by mouth daily. 07/08/14   [provider]  metroNIDAZOLE (FLAGYL) 500 MG tablet Take 1 tablet (500 mg total) by mouth 4 (four) times daily. Patient not taking: Reported on 02/12/2016 09/08/15   Bethann Berkshire, MD  Omega-3 Fatty Acids (FISH OIL PO) Take 1 capsule by mouth daily.    [provider]  ondansetron (ZOFRAN) 4 MG tablet Take 1 tablet (4 mg total) by mouth every 6 (six) hours. 02/12/16   Gwyneth Sprout, MD  oxyCODONE-acetaminophen (PERCOCET/ROXICET) 5-325 MG tablet Take 1-2 tablets by mouth every 6 (six) hours as needed for severe pain. 02/12/16   Gwyneth Sprout, MD  pantoprazole (PROTONIX) 40 MG tablet Take 1 tablet (40 mg total) by mouth daily. 08/21/14   Drema Dallas, MD    Physical Exam:  Constitutional: Older female who appears to be in discomfort Vitals:   08/22/20 0930 08/22/20 1000 08/22/20 1030 08/22/20 1045  BP: 133/86 128/84 138/85 118/78  Pulse: 85 85 88 89  Resp: 17 16 17 16   Temp:      TempSrc:      SpO2: 94% 94% 95% 93%  Weight:      Height:       Eyes: PERRL, lids and conjunctivae normal ENMT: Mucous membranes are dry.  Posterior pharynx clear of any exudate or lesions.  Neck: normal, supple, no masses, no thyromegaly Respiratory: clear to auscultation bilaterally, no wheezing, no crackles. Normal respiratory effort. No accessory muscle use.  Cardiovascular: Regular rate and rhythm, no murmurs / rubs / gallops. No extremity edema. 2+ pedal pulses. No carotid bruits.  Abdomen: Tenderness to palpation most significant of the left lower quadrant of the abdomen.  Bowel sounds are present. Musculoskeletal: no clubbing / cyanosis. No joint deformity upper and lower extremities. Good ROM, no contractures. Normal muscle tone.  Skin: no rashes, lesions, ulcers. No  induration Neurologic: CN 2-12 grossly intact. Sensation intact, DTR normal. Strength 5/5 in all 4.  Psychiatric: Normal judgment and insight. Alert and oriented x 3. Normal mood.     Labs on Admission: I have personally reviewed following labs and imaging studies  CBC: Recent Labs  Lab 08/21/20 2107  WBC 8.2  NEUTROABS 6.6  HGB 12.7  HCT 38.5  MCV 87.3  PLT 248   Basic Metabolic Panel: Recent Labs  Lab 08/21/20 2107  NA 139  K 3.3*  CL 103  CO2 26  GLUCOSE 123*  BUN 9  CREATININE 1.27*  CALCIUM 9.4   GFR: Estimated Creatinine Clearance: 41.4 mL/min (A) (by C-G formula based on SCr of 1.27 mg/dL (H)). Liver Function Tests: Recent Labs  Lab 08/21/20 2107  AST 28  ALT 22  ALKPHOS 74  BILITOT 0.4  PROT 7.2  ALBUMIN 4.1   Recent Labs  Lab 08/21/20 2107  LIPASE 33   No results for input(s): AMMONIA in the last 168 hours. Coagulation Profile: No results for input(s): INR, PROTIME in the last 168 hours. Cardiac Enzymes: No results for input(s): CKTOTAL, CKMB, CKMBINDEX, TROPONINI in the last 168  hours. BNP (last 3 results) No results for input(s): PROBNP in the last 8760 hours. HbA1C: No results for input(s): HGBA1C in the last 72 hours. CBG: No results for input(s): GLUCAP in the last 168 hours. Lipid Profile: No results for input(s): CHOL, HDL, LDLCALC, TRIG, CHOLHDL, LDLDIRECT in the last 72 hours. Thyroid Function Tests: No results for input(s): TSH, T4TOTAL, FREET4, T3FREE, THYROIDAB in the last 72 hours. Anemia Panel: No results for input(s): VITAMINB12, FOLATE, FERRITIN, TIBC, IRON, RETICCTPCT in the last 72 hours. Urine analysis:    Component Value Date/Time   COLORURINE YELLOW 08/21/2020 2046   APPEARANCEUR CLEAR 08/21/2020 2046   LABSPEC 1.013 08/21/2020 2046   PHURINE 5.0 08/21/2020 2046   GLUCOSEU NEGATIVE 08/21/2020 2046   HGBUR NEGATIVE 08/21/2020 2046   BILIRUBINUR NEGATIVE 08/21/2020 2046   KETONESUR NEGATIVE 08/21/2020 2046    PROTEINUR NEGATIVE 08/21/2020 2046   UROBILINOGEN 0.2 08/16/2014 2352   NITRITE NEGATIVE 08/21/2020 2046   LEUKOCYTESUR LARGE (A) 08/21/2020 2046   Sepsis Labs: No results found for this or any previous visit (from the past 240 hour(s)).   Radiological Exams on Admission: CT Abdomen Pelvis W Contrast  Result Date: 08/22/2020 CLINICAL DATA:  60 year old female with left lower quadrant abdominal pain. EXAM: CT ABDOMEN AND PELVIS WITH CONTRAST TECHNIQUE: Multidetector CT imaging of the abdomen and pelvis was performed using the standard protocol following bolus administration of intravenous contrast. CONTRAST:  80mL OMNIPAQUE IOHEXOL 300 MG/ML  SOLN COMPARISON:  CT Abdomen and Pelvis 02/12/2016 and earlier. FINDINGS: Lower chest: Improved atelectasis at the lung bases. Borderline to mild cardiomegaly. No pericardial or pleural effusion. Hepatobiliary: Benign hemangioma at the liver dome is stable since 2017. There is trace perihepatic free fluid. Otherwise negative liver and gallbladder. Pancreas: Negative. Spleen: Negative. Adrenals/Urinary Tract: Normal adrenal glands. Kidneys are nonobstructed and stable since 2017. Symmetric renal enhancement and contrast excretion. Decompressed ureters. Bladder remains within normal limits. Stomach/Bowel: Rectum is within normal limits. Sigmoid colon is thick-walled and inflamed from the junction with the descending to the junction with the rectum, involving a segment of about 24 cm. Confluent associated mesenteric inflammation. Regional small volume free fluid. No extraluminal gas. No diverticula in the region. But there is mild diverticulosis just upstream in the distal descending colon. That segment is decompressed and does not appear inflamed. There is trace free fluid in the left gutter. Redundant but partially decompressed transverse colon. Redundant right colon with mild retained stool. Normal appendix on series 3, image 49. Decompressed and negative terminal  ileum. No dilated small bowel. Stomach and duodenum are within normal limits. No free air. Vascular/Lymphatic: Aortoiliac calcified atherosclerosis. Major arterial structures are patent including the IMA. Portal venous system is patent. No lymphadenopathy. Reproductive: Surrounded by a small volume of simple density free fluid. Otherwise negative. Other: Small volume of simple density free fluid in the bilateral pelvis. Musculoskeletal: Osteopenia. Chronic grade 1 anterolisthesis of L5 on S1 with associated facet arthropathy. No acute osseous abnormality identified. IMPRESSION: 1. Positive for Moderate Acute Sigmoid Colitis. Inflamed roughly 24 cm segment of sigmoid colon with associated small volume of free fluid in the abdomen and pelvis. But no abscess, perforation, or other complicating features. 2. Mild aortic atherosclerosis. Electronically Signed   By: Odessa Fleming M.D.   On: 08/22/2020 07:29    Chest x-ray: Independently reviewed.  Low lung volumes with mild heart enlargement appreciated  Assessment/Plan Sigmoid colitis history of ischemic colitis: Acute.  Patient presents with complaints of abdominal pain.  Found to  have sigmoid colitis with associated free fluid in the abdomen and pelvis but no abscess, perforation, or complicating features appreciated.  Patient has been started on empiric antibiotics of metronidazole at Rocephin.  Patient reportedly had colonoscopy last in 2016 with Dr. Matthias Hughs, but records are not available at this time. -Admit to a medical telemetry bed -Monitor intake and output -Clear liquid diet as tolerated -Check portable x-ray -Continue metronidazole and Rocephin IV -Probiotics -Morphine IV as needed for pain -[facility] consulted, will follow-up for any further recommendations   Hypokalemia: Acute.  Potassium initially noted to be 3.3 admission. -Normal saline IV fluids with 20 mEq of potassium chloride at 100 mL/h x 20 hours -Reassess and adjust IV fluids as  needed  Renal insufficiency: Acute.  Patient presents with creatinine 1.27, but previously creatinine noted to be around 1.  Elevation is not greater than 0.3 above patient's baseline to signify an acute kidney injury at this time. -Continue IV fluids as seen above -Hold hydrochlorothiazide -Recheck kidney function tomorrow morning  Essential hypertension: Patient blood pressures maintained -Held home blood pressure medication, resume when medically -Labetalol IV as needed for systolic blood pressures greater than 180  Migraine headaches -Continue amitriptyline  Rheumatoid arthritis -Continue Plaquenil  GERD -Pharmacy substitution of Protonix  Follow-up COVID-19 screening  DVT prophylaxis: Lovenox Code Status: Full Family Communication: Patient declined need to update family at this time Disposition Plan: Home Consults called: GI Admission status: Inpatient, require more than 2 midnight stay  Clydie Braun MD [facility]   If 7PM-7AM, please contact night-coverage   08/22/2020, 1:51 PM

## 2020-08-22 NOTE — ED Notes (Signed)
Pt reports black runny stool.

## 2020-08-22 NOTE — ED Provider Notes (Signed)
[facility] EMERGENCY DEPARTMENT Provider Note   CSN: [id number] Arrival date & time: 08/21/20  2010     History Chief Complaint  Patient presents with   Abdominal Pain    Michelle Tapia is a 60 y.o. female history includes colitis, GERD, hypertension, rheumatoid arthritis.  Patient presents today for abdominal pain onset yesterday, pain initially mild, gradually worsening, left-sided, sharp, no clear aggravating or alleviating factors.  Associated with multiple episodes of nonbloody diarrhea.  Patient also experiencing nausea without vomiting.  Patient denies fever/chills, fall/injury, dysuria/hematuria, vaginal excess discharge, back pain, chest pain, shortness of breath or any additional concerns.  HPI     Past Medical History:  Diagnosis Date   Colitis    GERD (gastroesophageal reflux disease)    Hypertension    RA (rheumatoid arthritis) (HCC)     Patient Active Problem List   Diagnosis Date Noted   Ischemic colitis (HCC)    Melena    Rectal bleeding    Acute ischemic colitis (HCC) 08/17/2014   Benign essential HTN 08/17/2014   RA (rheumatoid arthritis) (HCC) 08/17/2014   GERD (gastroesophageal reflux disease) 08/17/2014    Past Surgical History:  Procedure Laterality Date   BREAST EXCISIONAL BIOPSY Right    benign     OB History   No obstetric history on file.     No family history on file.  Social History   Tobacco Use   Smoking status: Never   Smokeless tobacco: Never  Substance Use Topics   Alcohol use: No   Drug use: No    Home Medications Prior to Admission medications   Medication Sig Start Date End Date Taking? Authorizing Provider  acidophilus (RISAQUAD) CAPS capsule Take 1 capsule by mouth daily.    [provider]  aspirin EC 81 MG tablet Take 81 mg by mouth daily.    [provider]  Butalbital-APAP-Caffeine 50-300-40 MG CAPS Take 1 tablet by mouth every 8 (eight) hours as needed (for  pain).  08/16/14   [provider]  CALCIUM PO Take 1 tablet by mouth daily.    [provider]  dicyclomine (BENTYL) 20 MG tablet Take 1 tablet (20 mg total) by mouth 2 (two) times daily. 02/12/16   Gwyneth Sprout, MD  folic acid (FOLVITE) 1 MG tablet Take 1 mg by mouth daily. 07/19/14   [provider]  hydroxychloroquine (PLAQUENIL) 200 MG tablet Take 400 mg by mouth daily. 08/09/14   [provider]  lisinopril-hydrochlorothiazide (PRINZIDE,ZESTORETIC) 10-12.5 MG per tablet Take 1 tablet by mouth daily. 07/08/14   [provider]  metroNIDAZOLE (FLAGYL) 500 MG tablet Take 1 tablet (500 mg total) by mouth 4 (four) times daily. Patient not taking: Reported on 02/12/2016 09/08/15   Bethann Berkshire, MD  Omega-3 Fatty Acids (FISH OIL PO) Take 1 capsule by mouth daily.    [provider]  ondansetron (ZOFRAN) 4 MG tablet Take 1 tablet (4 mg total) by mouth every 6 (six) hours. 02/12/16   Gwyneth Sprout, MD  oxyCODONE-acetaminophen (PERCOCET/ROXICET) 5-325 MG tablet Take 1-2 tablets by mouth every 6 (six) hours as needed for severe pain. 02/12/16   Gwyneth Sprout, MD  pantoprazole (PROTONIX) 40 MG tablet Take 1 tablet (40 mg total) by mouth daily. 08/21/14   Drema Dallas, MD    Allergies    Patient has no known allergies.  Review of Systems   Review of Systems Ten systems are reviewed and are negative for acute change except as  noted in the HPI  Physical Exam Updated Vital Signs BP 118/78   Pulse 89   Temp 98.7 F (37.1 C) (Oral)   Resp 16   Ht  (1.575 m)   Wt 67.6 kg   SpO2 93%   BMI 27.25 kg/m   Physical Exam Constitutional:      General: She is not in acute distress.    Appearance: Normal appearance. She is well-developed. She is not ill-appearing or diaphoretic.  HENT:     Head: Normocephalic and atraumatic.  Eyes:     General: Vision grossly intact. Gaze aligned appropriately.     Pupils: Pupils are equal, round, and  reactive to light.  Neck:     Trachea: Trachea and phonation normal.  Pulmonary:     Effort: Pulmonary effort is normal. No respiratory distress.  Abdominal:     General: There is no distension.     Palpations: Abdomen is soft.     Tenderness: There is abdominal tenderness (Left side abdominal pain). There is no guarding or rebound.  Musculoskeletal:        General: Normal range of motion.     Cervical back: Normal range of motion.  Skin:    General: Skin is warm and dry.  Neurological:     Mental Status: She is alert.     GCS: GCS eye subscore is 4. GCS verbal subscore is 5. GCS motor subscore is 6.     Comments: Speech is clear and goal oriented, follows commands Major Cranial nerves without deficit, no facial droop Moves extremities without ataxia, coordination intact  Psychiatric:        Behavior: Behavior normal.    ED Results / Procedures / Treatments   Labs (all labs ordered are listed, but only abnormal results are displayed) Labs Reviewed  COMPREHENSIVE METABOLIC PANEL - Abnormal; Notable for the following components:      Result Value   Potassium 3.3 (*)    Glucose, Bld 123 (*)    Creatinine, Ser 1.27 (*)    GFR, Estimated 48 (*)    All other components within normal limits  URINALYSIS, ROUTINE W REFLEX MICROSCOPIC - Abnormal; Notable for the following components:   Leukocytes,Ua LARGE (*)    Bacteria, UA RARE (*)    All other components within normal limits  LIPASE, BLOOD  CBC WITH DIFFERENTIAL/PLATELET    EKG None  Radiology CT Abdomen Pelvis W Contrast  Result Date: 08/22/2020 CLINICAL DATA:  60 year old female with left lower quadrant abdominal pain. EXAM: CT ABDOMEN AND PELVIS WITH CONTRAST TECHNIQUE: Multidetector CT imaging of the abdomen and pelvis was performed using the standard protocol following bolus administration of intravenous contrast. CONTRAST:  80mL OMNIPAQUE IOHEXOL 300 MG/ML  SOLN COMPARISON:  CT Abdomen and Pelvis 02/12/2016 and earlier.  FINDINGS: Lower chest: Improved atelectasis at the lung bases. Borderline to mild cardiomegaly. No pericardial or pleural effusion. Hepatobiliary: Benign hemangioma at the liver dome is stable since 2017. There is trace perihepatic free fluid. Otherwise negative liver and gallbladder. Pancreas: Negative. Spleen: Negative. Adrenals/Urinary Tract: Normal adrenal glands. Kidneys are nonobstructed and stable since 2017. Symmetric renal enhancement and contrast excretion. Decompressed ureters. Bladder remains within normal limits. Stomach/Bowel: Rectum is within normal limits. Sigmoid colon is thick-walled and inflamed from the junction with the descending to the junction with the rectum, involving a segment of about 24 cm. Confluent associated mesenteric inflammation. Regional small volume free fluid. No extraluminal gas. No diverticula in the region. But there is mild  diverticulosis just upstream in the distal descending colon. That segment is decompressed and does not appear inflamed. There is trace free fluid in the left gutter. Redundant but partially decompressed transverse colon. Redundant right colon with mild retained stool. Normal appendix on series 3, image 49. Decompressed and negative terminal ileum. No dilated small bowel. Stomach and duodenum are within normal limits. No free air. Vascular/Lymphatic: Aortoiliac calcified atherosclerosis. Major arterial structures are patent including the IMA. Portal venous system is patent. No lymphadenopathy. Reproductive: Surrounded by a small volume of simple density free fluid. Otherwise negative. Other: Small volume of simple density free fluid in the bilateral pelvis. Musculoskeletal: Osteopenia. Chronic grade 1 anterolisthesis of L5 on S1 with associated facet arthropathy. No acute osseous abnormality identified. IMPRESSION: 1. Positive for Moderate Acute Sigmoid Colitis. Inflamed roughly 24 cm segment of sigmoid colon with associated small volume of free fluid in  the abdomen and pelvis. But no abscess, perforation, or other complicating features. 2. Mild aortic atherosclerosis. Electronically Signed   By: Odessa Fleming M.D.   On: 08/22/2020 07:29    Procedures Procedures   Medications Ordered in ED Medications  ondansetron (ZOFRAN-ODT) disintegrating tablet 4 mg (4 mg Oral Not Given 08/22/20 0646)  acetaminophen (TYLENOL) tablet 650 mg (650 mg Oral Not Given 08/22/20 0646)  cefTRIAXone (ROCEPHIN) 2 g in sodium chloride 0.9 % 100 mL IVPB (has no administration in time range)    And  metroNIDAZOLE (FLAGYL) IVPB 500 mg (has no administration in time range)  iohexol (OMNIPAQUE) 300 MG/ML solution 80 mL (80 mLs Intravenous Contrast Given 08/22/20 0656)  sodium chloride 0.9 % bolus 1,000 mL (1,000 mLs Intravenous Bolus 08/22/20 0741)  morphine 4 MG/ML injection 4 mg (4 mg Intravenous Given 08/22/20 0741)    ED Course  I have reviewed the triage vital signs and the nursing notes.  Pertinent labs & imaging results that were available during my care of the patient were reviewed by me and considered in my medical decision making (see chart for details).    MDM Rules/Calculators/A&P                          Additional history obtained from: Nursing notes from this visit. Review of medical records. ------ 60 year old female presented for abdominal pain nausea and diarrhea onset yesterday.  On exam she is tender in the left abdomen.  Basic labs and CT were ordered in triage.  I ordered, reviewed and interpreted labs which include: CBC without leukocytosis, anemia or thrombocytopenia. Lipase within normal limits, doubt pancreatitis CMP shows no emergent electrolyte derangement, LFT elevations or gap.  Creatinine of 1.27 appears similar to prior.  Potassium slightly low at 3.3. Urinalysis shows WBCs, bacteria and leukocytes however nitrite is negative.  Patient without urinary symptoms suspicion for UTI at this time.  CTAP:    IMPRESSION:  1. Positive for Moderate  Acute Sigmoid Colitis. Inflamed roughly 24  cm segment of sigmoid colon with associated small volume of free  fluid in the abdomen and pelvis. But no abscess, perforation, or  other complicating features.     2. Mild aortic atherosclerosis.    Patient was treated with IV fluids and pain medication.  On reassessment she reports continued abdominal pain.  Feel that patient's symptoms not significantly improved after morphine, patient will need admission for further evaluation and treatment.  Patient was treated with IV Rocephin and Flagyl.  Consult was called to hospitalist team, Dr. Katrinka Blazing who accepted patient  for admission.  Patient was seen and evaluated by Dr. Stevie Kern during this visit who agrees with work-up/treatment and admission.  Note: Portions of this report may have been transcribed using voice recognition software. Every effort was made to ensure accuracy; however, inadvertent computerized transcription errors may still be present.  Final Clinical Impression(s) / ED Diagnoses Final diagnoses:  Colitis    Rx / DC Orders ED Discharge Orders     None        Elizabeth Palau 08/22/20 1215    Milagros Loll, MD 08/23/20 [phone]

## 2020-08-22 NOTE — ED Notes (Signed)
Attempted report X2 

## 2020-08-22 NOTE — ED Notes (Signed)
Attempted report X1

## 2020-08-22 NOTE — ED Notes (Signed)
Patient transported to CT scan . 

## 2020-08-22 NOTE — Progress Notes (Signed)
Patient received to the unit. Patient is alert and oriented x4. Iv in place and running fluid. Skin assessment done with another nurse. Given instructions about call bell and phone. Bed in low position and call bell in reach. 

## 2020-08-23 LAB — BASIC METABOLIC PANEL
Anion gap: 6 (ref 5–15)
BUN: 7 mg/dL — ABNORMAL LOW (ref 8–23)
CO2: 27 mmol/L (ref 22–32)
Calcium: 8.5 mg/dL — ABNORMAL LOW (ref 8.9–10.3)
Chloride: 106 mmol/L (ref 98–111)
Creatinine, Ser: 1.13 mg/dL — ABNORMAL HIGH (ref 0.44–1.00)
GFR, Estimated: 55 mL/min — ABNORMAL LOW (ref >60)
Glucose, Bld: 98 mg/dL (ref 70–99)
Potassium: 3.2 mmol/L — ABNORMAL LOW (ref 3.5–5.1)
Sodium: 139 mmol/L (ref 135–145)

## 2020-08-23 LAB — CBC
HCT: 34.1 % — ABNORMAL LOW (ref 36.0–46.0)
Hemoglobin: 11.3 g/dL — ABNORMAL LOW (ref 12.0–15.0)
MCH: 28.9 pg (ref 26.0–34.0)
MCHC: 33.1 g/dL (ref 30.0–36.0)
MCV: 87.2 fL (ref 80.0–100.0)
Platelets: 197 10*3/uL (ref 150–400)
RBC: 3.91 MIL/uL (ref 3.87–5.11)
RDW: 13.6 % (ref 11.5–15.5)
WBC: 6.8 10*3/uL (ref 4.0–10.5)
nRBC: 0 % (ref 0.0–0.2)

## 2020-08-23 LAB — MAGNESIUM: Magnesium: 1.7 mg/dL (ref 1.7–2.4)

## 2020-08-23 MED ORDER — POTASSIUM CHLORIDE CRYS ER 20 MEQ PO TBCR
40.0000 meq | EXTENDED_RELEASE_TABLET | Freq: Once | ORAL | Status: AC
  Administered 2020-08-23: 40 meq via ORAL
  Filled 2020-08-23: qty 4

## 2020-08-23 MED ORDER — SIMETHICONE 80 MG PO CHEW
80.0000 mg | CHEWABLE_TABLET | Freq: Four times a day (QID) | ORAL | Status: DC | PRN
  Administered 2020-08-23: 80 mg via ORAL
  Filled 2020-08-23 (×2): qty 1

## 2020-08-23 MED ORDER — LISINOPRIL 10 MG PO TABS
10.0000 mg | ORAL_TABLET | Freq: Every day | ORAL | Status: DC
  Administered 2020-08-23 – 2020-08-24 (×2): 10 mg via ORAL
  Filled 2020-08-23 (×2): qty 1

## 2020-08-23 MED ORDER — HYDROCHLOROTHIAZIDE 12.5 MG PO CAPS
12.5000 mg | ORAL_CAPSULE | Freq: Every day | ORAL | Status: DC
  Administered 2020-08-23 – 2020-08-24 (×2): 12.5 mg via ORAL
  Filled 2020-08-23 (×2): qty 1

## 2020-08-23 MED ORDER — POTASSIUM CHLORIDE IN NACL 20-0.9 MEQ/L-% IV SOLN
INTRAVENOUS | Status: DC
  Filled 2020-08-23 (×3): qty 1000

## 2020-08-23 MED ORDER — SENNOSIDES-DOCUSATE SODIUM 8.6-50 MG PO TABS: 2.0000 | ORAL_TABLET | Freq: Every day | ORAL | Status: DC

## 2020-08-23 MED ORDER — LISINOPRIL-HYDROCHLOROTHIAZIDE 10-12.5 MG PO TABS: 1.0000 | ORAL_TABLET | Freq: Every day | ORAL | Status: DC

## 2020-08-23 NOTE — Progress Notes (Signed)
PROGRESS NOTE    Michelle Tapia  PPJ:[id number] DOB: 04/06/1960 DOA: 08/21/2020 PCP: Mila Palmer, MD     Brief Narrative:  Michelle Tapia is a 60 y.o. female with medical history significant of hypertension, rheumatoid arthritis, GERD, and history of ischemic colitis presents with complaints of lower abdominal pain which started yesterday evening around 5 PM yesterday.  Pain was initially noted to be sharp in nature and initially was across her whole lower abdomen.  Pain now at this time is mostly located in the lower left quadrant of her abdomen with what she describes as a continued soreness.  Noted associated symptoms of nausea, dysuria, headache, and several episodes of nonbloody diarrhea.  CT scan of the abdomen and pelvis with contrast significant for moderate acute sigmoid colitis with small volume of free fluid in abdomen without signs of abscess or perforation. GI was consulted.   New events last 24 hours / Subjective: Patient states that she had loose bowel movement about an hour prior to my evaluation.  Abdominal pain has improved.  Assessment & Plan:   Principal Problem:   Colitis Active Problems:   Benign essential HTN   RA (rheumatoid arthritis) (HCC)   Hypokalemia   Renal insufficiency   Migraine headache   Acute sigmoid colitis -Appreciate GI.  Follow-up as outpatient in 4 to 6 weeks for outpatient colonoscopy once acute issues are resolved -Advance to soft diet today -Continue Rocephin, Flagyl.  Recommended to continue antibiotics for total 2 weeks -Continue IV fluid  Hypokalemia -Replace, trend  Essential hypertension -Resume lisinopril-HCTZ   CKD stage IIIa -Stable  Rheumatoid arthritis -Continue Plaquenil  Migraine -Continue amitriptyline  GERD -Continue Protonix   DVT prophylaxis:  enoxaparin (LOVENOX) injection 40 mg Start: 08/22/20 1500  Code Status:     Code Status Orders  (From admission, onward)            Start     Ordered   08/22/20 1359  Full code  Continuous        08/22/20 1401           Code Status History     Date Active Date Inactive Code Status Order ID Comments User Context   08/17/2014 2034 08/21/2014 2022 Full Code [id number]  Bobette Mo, MD Inpatient      Family Communication: No family at bedside Disposition Plan:  Status is: Inpatient  Remains inpatient appropriate because:IV treatments appropriate due to intensity of illness or inability to take PO and Inpatient level of care appropriate due to severity of illness  Dispo: The patient is from: Home              Anticipated d/c is to: Home              Patient currently is not medically stable to d/c.   Difficult to place patient No      Consultants:  GI  Procedures:  None  Antimicrobials:  Anti-infectives (From admission, onward)    Start     Dose/Rate Route Frequency Ordered Stop   08/23/20 1000  cefTRIAXone (ROCEPHIN) 2 g in sodium chloride 0.9 % 100 mL IVPB       See Hyperspace for full Linked Orders Report.   2 g 200 mL/hr over 30 Minutes Intravenous Every 24 hours 08/22/20 1356     08/22/20 1645  hydroxychloroquine (PLAQUENIL) tablet 400 mg        400 mg Oral Daily 08/22/20 1550     08/22/20 1600  metroNIDAZOLE (FLAGYL) IVPB 500 mg       See Hyperspace for full Linked Orders Report.   500 mg 100 mL/hr over 60 Minutes Intravenous Every 8 hours 08/22/20 1356     08/22/20 1115  cefTRIAXone (ROCEPHIN) 2 g in sodium chloride 0.9 % 100 mL IVPB       See Hyperspace for full Linked Orders Report.   2 g 200 mL/hr over 30 Minutes Intravenous  Once 08/22/20 1102 08/22/20 1413   08/22/20 1115  metroNIDAZOLE (FLAGYL) IVPB 500 mg       See Hyperspace for full Linked Orders Report.   500 mg 100 mL/hr over 60 Minutes Intravenous  Once 08/22/20 1102 08/22/20 1434        Objective: Vitals:   08/22/20 1517 08/23/20 0102 08/23/20 0526 08/23/20 1218  BP: 131/82 109/77 124/73 115/73  Pulse: 90 80  84 87  Resp: Temp: 98.7 F (37.1 C) 97.8 F (36.6 C) 98.9 F (37.2 C) 97.7 F (36.5 C)  TempSrc: Oral Oral Oral Oral  SpO2: 100% 99% 97% 100%  Weight: 67.7 kg     Height:  (1.575 m)       Intake/Output Summary (Last 24 hours) at 08/23/2020 1254 Last data filed at 08/22/2020 2250 Gross per 24 hour  Intake 400.14 ml  Output --  Net 400.14 ml   Filed Weights   08/21/20 2029 08/22/20 1517  Weight: 67.6 kg 67.7 kg    Examination:  General exam: Appears calm and comfortable  Respiratory system: Clear to auscultation. Respiratory effort normal. No respiratory distress. No conversational dyspnea.  Cardiovascular system: S1 & S2 heard, RRR. No murmurs. No pedal edema. Gastrointestinal system: Abdomen is nondistended, soft and mildly tender to palpation left lower quadrant Central nervous system: Alert and oriented. No focal neurological deficits. Speech clear.  Extremities: Symmetric in appearance  Skin: No rashes, lesions or ulcers on exposed skin  Psychiatry: Judgement and insight appear normal. Mood & affect appropriate.   Data Reviewed: I have personally reviewed following labs and imaging studies  CBC: Recent Labs  Lab 08/21/20 2107 08/23/20 0306  WBC 8.2 6.8  NEUTROABS 6.6  --   HGB 12.7 11.3*  HCT 38.5 34.1*  MCV 87.3 87.2  PLT 248 197   Basic Metabolic Panel: Recent Labs  Lab 08/21/20 2107 08/23/20 0306  NA 139 139  K 3.3* 3.2*  CL 103 106  CO2 26 27  GLUCOSE 123* 98  BUN 9 7*  CREATININE 1.27* 1.13*  CALCIUM 9.4 8.5*  MG  --  1.7   GFR: Estimated Creatinine Clearance: 46.5 mL/min (A) (by C-G formula based on SCr of 1.13 mg/dL (H)). Liver Function Tests: Recent Labs  Lab 08/21/20 2107  AST 28  ALT 22  ALKPHOS 74  BILITOT 0.4  PROT 7.2  ALBUMIN 4.1   Recent Labs  Lab 08/21/20 2107  LIPASE 33   No results for input(s): AMMONIA in the last 168 hours. Coagulation Profile: No results for input(s): INR, PROTIME in the last  168 hours. Cardiac Enzymes: No results for input(s): CKTOTAL, CKMB, CKMBINDEX, TROPONINI in the last 168 hours. BNP (last 3 results) No results for input(s): PROBNP in the last 8760 hours. HbA1C: No results for input(s): HGBA1C in the last 72 hours. CBG: No results for input(s): GLUCAP in the last 168 hours. Lipid Profile: No results for input(s): CHOL, HDL, LDLCALC, TRIG, CHOLHDL, LDLDIRECT in the last 72 hours. Thyroid Function Tests: No results for  input(s): TSH, T4TOTAL, FREET4, T3FREE, THYROIDAB in the last 72 hours. Anemia Panel: No results for input(s): VITAMINB12, FOLATE, FERRITIN, TIBC, IRON, RETICCTPCT in the last 72 hours. Sepsis Labs: No results for input(s): PROCALCITON, LATICACIDVEN in the last 168 hours.  Recent Results (from the past 240 hour(s))  SARS CORONAVIRUS 2 (TAT 6-24 HRS) Nasopharyngeal Nasopharyngeal Swab     Status: None   Collection Time: 08/22/20  2:29 PM   Specimen: Nasopharyngeal Swab  Result Value Ref Range Status   SARS Coronavirus 2 NEGATIVE NEGATIVE Final    Comment: (NOTE) SARS-CoV-2 target nucleic acids are NOT DETECTED.  The SARS-CoV-2 RNA is generally detectable in upper and lower respiratory specimens during the acute phase of infection. Negative results do not preclude SARS-CoV-2 infection, do not rule out co-infections with other pathogens, and should not be used as the sole basis for treatment or other patient management decisions. Negative results must be combined with clinical observations, patient history, and epidemiological information. The expected result is Negative.  Fact Sheet for Patients: HairSlick.no  Fact Sheet for Healthcare Providers: quierodirigir.com  This test is not yet approved or cleared by the [place] FDA and  has been authorized for detection and/or diagnosis of SARS-CoV-2 by FDA under an Emergency Use Authorization (EUA). This EUA will remain  in  effect (meaning this test can be used) for the duration of the COVID-19 declaration under Se ction 564(b)(1) of the Act, 21 U.S.C. section 360bbb-3(b)(1), unless the authorization is terminated or revoked sooner.  Performed at [facility], [address] [place], [place] [address]       Radiology Studies: CT Abdomen Pelvis W Contrast  Result Date: 08/22/2020 CLINICAL DATA:  60 year old female with left lower quadrant abdominal pain. EXAM: CT ABDOMEN AND PELVIS WITH CONTRAST TECHNIQUE: Multidetector CT imaging of the abdomen and pelvis was performed using the standard protocol following bolus administration of intravenous contrast. CONTRAST:  61mL OMNIPAQUE IOHEXOL 300 MG/ML  SOLN COMPARISON:  CT Abdomen and Pelvis 02/12/2016 and earlier. FINDINGS: Lower chest: Improved atelectasis at the lung bases. Borderline to mild cardiomegaly. No pericardial or pleural effusion. Hepatobiliary: Benign hemangioma at the liver dome is stable since 2017. There is trace perihepatic free fluid. Otherwise negative liver and gallbladder. Pancreas: Negative. Spleen: Negative. Adrenals/Urinary Tract: Normal adrenal glands. Kidneys are nonobstructed and stable since 2017. Symmetric renal enhancement and contrast excretion. Decompressed ureters. Bladder remains within normal limits. Stomach/Bowel: Rectum is within normal limits. Sigmoid colon is thick-walled and inflamed from the junction with the descending to the junction with the rectum, involving a segment of about 24 cm. Confluent associated mesenteric inflammation. Regional small volume free fluid. No extraluminal gas. No diverticula in the region. But there is mild diverticulosis just upstream in the distal descending colon. That segment is decompressed and does not appear inflamed. There is trace free fluid in the left gutter. Redundant but partially decompressed transverse colon. Redundant right colon with mild retained stool. Normal appendix on series 3,  image 49. Decompressed and negative terminal ileum. No dilated small bowel. Stomach and duodenum are within normal limits. No free air. Vascular/Lymphatic: Aortoiliac calcified atherosclerosis. Major arterial structures are patent including the IMA. Portal venous system is patent. No lymphadenopathy. Reproductive: Surrounded by a small volume of simple density free fluid. Otherwise negative. Other: Small volume of simple density free fluid in the bilateral pelvis. Musculoskeletal: Osteopenia. Chronic grade 1 anterolisthesis of L5 on S1 with associated facet arthropathy. No acute osseous abnormality identified. IMPRESSION: 1. Positive for  Moderate Acute Sigmoid Colitis. Inflamed roughly 24 cm segment of sigmoid colon with associated small volume of free fluid in the abdomen and pelvis. But no abscess, perforation, or other complicating features. 2. Mild aortic atherosclerosis. Electronically Signed   By: Odessa Fleming M.D.   On: 08/22/2020 07:29   DG CHEST PORT 1 VIEW  Result Date: 08/22/2020 CLINICAL DATA:  Colitis EXAM: PORTABLE CHEST 1 VIEW COMPARISON:  None. FINDINGS: Cardiomegaly. Lower lobe airspace opacities, likely atelectasis. No visible effusions or acute bony abnormality. IMPRESSION: Low lung volumes with bibasilar atelectasis.  Mild cardiomegaly. Electronically Signed   By: Charlett Nose M.D.   On: 08/22/2020 16:59      Scheduled Meds:  amitriptyline  25-50 mg Oral Daily   aspirin EC  81 mg Oral Daily   enoxaparin (LOVENOX) injection  40 mg Subcutaneous Q24H   hydroxychloroquine  400 mg Oral Daily   ondansetron  4 mg Oral Once   pantoprazole  40 mg Oral Daily   saccharomyces boulardii  250 mg Oral BID   sodium chloride flush  3 mL Intravenous Q12H   Continuous Infusions:  0.9 % NaCl with KCl 20 mEq / L 100 mL/hr at 08/23/20 0856   cefTRIAXone (ROCEPHIN)  IV 2 g (08/23/20 1017)   And   metronidazole 500 mg (08/23/20 0750)     LOS: 1 day      Time spent: 25 minutes   Noralee Stain,  DO [facility] 08/23/2020, 12:54 PM   Available via Epic secure chat 7am-7pm After these hours, please refer to coverage provider listed on [email or web address]

## 2020-08-23 NOTE — Consult Note (Signed)
Referring Provider: [facility] Primary Care Physician:  Mila Palmer, MD Primary Gastroenterologist:  [facility]/ Dr. Madilyn Fireman   Reason for Consultation: Abnormal CT scan/colitis  HPI: Michelle Tapia is a 60 y.o. female with past medical history of rheumatoid arthritis, history of NSAID use and GERD presented to the hospital with nausea and diarrhea.  She started noticing lower abdominal discomfort and loose stools yesterday evening.  Had few episodes of loose stools without any blood in it.  Also had some nausea.  Denies any vomiting.  Had 2 bowel movement since admission.  Denies any fever or chills.  She had similar symptoms in 2016 and CT scan at that time showed segmental colitis at the splenic flexure.   Colonoscopy for evaluation of rectal bleeding and abnormal CT scan in September 2016 was normal.  Biopsies were not performed.  Repeat was recommended in 10 years.  Past Medical History:  Diagnosis Date   Colitis    GERD (gastroesophageal reflux disease)    Hypertension    RA (rheumatoid arthritis) (HCC)     Past Surgical History:  Procedure Laterality Date   BREAST EXCISIONAL BIOPSY Right    benign    Prior to Admission medications   Medication Sig Start Date End Date Taking? Authorizing Provider  amitriptyline (ELAVIL) 25 MG tablet Take 1-2 tablets by mouth daily. 02/27/20  Yes [provider]  aspirin EC 81 MG tablet Take 81 mg by mouth daily.   Yes [provider]  Calcium Carb-Cholecalciferol (CALCIUM 500 + D) 500-125 MG-UNIT TABS Take 1 tablet by mouth daily at 6 (six) AM.   Yes [provider]  hydroxychloroquine (PLAQUENIL) 200 MG tablet Take 400 mg by mouth daily. 08/09/14  Yes [provider]  lisinopril-hydrochlorothiazide (PRINZIDE,ZESTORETIC) 10-12.5 MG per tablet Take 1 tablet by mouth daily. 07/08/14  Yes [provider]  Omega-3 Fatty Acids (FISH OIL PO) Take 1 capsule by mouth daily.   Yes [provider]   omeprazole (PRILOSEC) 20 MG capsule Take 20 mg by mouth 2 (two) times daily before a meal.   Yes [provider]  polyethylene glycol (MIRALAX / GLYCOLAX) 17 g packet Take 17 g by mouth daily.   Yes [provider]  dicyclomine (BENTYL) 20 MG tablet Take 1 tablet (20 mg total) by mouth 2 (two) times daily. Patient not taking: No sig reported 02/12/16 08/22/20  Gwyneth Sprout, MD  pantoprazole (PROTONIX) 40 MG tablet Take 1 tablet (40 mg total) by mouth daily. Patient not taking: Reported on 08/22/2020 08/21/14 08/22/20  Drema Dallas, MD    Scheduled Meds:  amitriptyline  25-50 mg Oral Daily   aspirin EC  81 mg Oral Daily   enoxaparin (LOVENOX) injection  40 mg Subcutaneous Q24H   hydroxychloroquine  400 mg Oral Daily   ondansetron  4 mg Oral Once   pantoprazole  40 mg Oral Daily   saccharomyces boulardii  250 mg Oral BID   sodium chloride flush  3 mL Intravenous Q12H   Continuous Infusions:  0.9 % NaCl with KCl 20 mEq / L 100 mL/hr at 08/23/20 0856   cefTRIAXone (ROCEPHIN)  IV     And   metronidazole 500 mg (08/23/20 0750)   PRN Meds:.acetaminophen **OR** acetaminophen, albuterol, labetalol, morphine injection, ondansetron **OR** ondansetron (ZOFRAN) IV  Allergies as of 08/21/2020   (No Known Allergies)    History reviewed. No pertinent family history.  Social History   Socioeconomic History   Marital status: Unknown  Spouse name: Not on file   Number of children: Not on file   Years of education: Not on file   Highest education level: Not on file  Occupational History   Not on file  Tobacco Use   Smoking status: Never   Smokeless tobacco: Never  Substance and Sexual Activity   Alcohol use: No   Drug use: No   Sexual activity: Not on file  Other Topics Concern   Not on file  Social History Narrative   Not on file   Social Determinants of Health   Financial Resource Strain: Not on file  Food Insecurity: Not on file  Transportation Needs: Not  on file  Physical Activity: Not on file  Stress: Not on file  Social Connections: Not on file  Intimate Partner Violence: Not on file    Review of Systems: 12 point review of system is done which is negative except as mentioned in HPI  Physical Exam: Vital signs: Vitals:   08/23/20 0102 08/23/20 0526  BP: 109/77 124/73  Pulse: 80 84  Resp: 18 18  Temp: 97.8 F (36.6 C) 98.9 F (37.2 C)  SpO2: 99% 97%   Last BM Date: 08/22/20 General:   Alert,  Well-developed, well-nourished, pleasant and cooperative in NAD HEENT : NS, AT, oral mucosa moist Extraocular movements intact.  No scleral icterus Lungs:  Clear throughout to auscultation.   No wheezes, crackles, or rhonchi. No acute distress. Heart:  Regular rate and rhythm; no murmurs, clicks, rubs,  or gallops. Abdomen: Soft, nontender, nondistended, bowel sounds present, no peritoneal signs. Psych : Mood and affect normal Neuro : Alert and oriented x3 LE : no edema   GI:  Lab Results: Recent Labs    08/21/20 2107 08/23/20 0306  WBC 8.2 6.8  HGB 12.7 11.3*  HCT 38.5 34.1*  PLT 248 197   BMET Recent Labs    08/21/20 2107 08/23/20 0306  NA 139 139  K 3.3* 3.2*  CL 103 106  CO2 26 27  GLUCOSE 123* 98  BUN 9 7*  CREATININE 1.27* 1.13*  CALCIUM 9.4 8.5*   LFT Recent Labs    08/21/20 2107  PROT 7.2  ALBUMIN 4.1  AST 28  ALT 22  ALKPHOS 74  BILITOT 0.4   PT/INR No results for input(s): LABPROT, INR in the last 72 hours.   Studies/Results: CT Abdomen Pelvis W Contrast  Result Date: 08/22/2020 CLINICAL DATA:  60 year old female with left lower quadrant abdominal pain. EXAM: CT ABDOMEN AND PELVIS WITH CONTRAST TECHNIQUE: Multidetector CT imaging of the abdomen and pelvis was performed using the standard protocol following bolus administration of intravenous contrast. CONTRAST:  30mL OMNIPAQUE IOHEXOL 300 MG/ML  SOLN COMPARISON:  CT Abdomen and Pelvis 02/12/2016 and earlier. FINDINGS: Lower chest: Improved  atelectasis at the lung bases. Borderline to mild cardiomegaly. No pericardial or pleural effusion. Hepatobiliary: Benign hemangioma at the liver dome is stable since 2017. There is trace perihepatic free fluid. Otherwise negative liver and gallbladder. Pancreas: Negative. Spleen: Negative. Adrenals/Urinary Tract: Normal adrenal glands. Kidneys are nonobstructed and stable since 2017. Symmetric renal enhancement and contrast excretion. Decompressed ureters. Bladder remains within normal limits. Stomach/Bowel: Rectum is within normal limits. Sigmoid colon is thick-walled and inflamed from the junction with the descending to the junction with the rectum, involving a segment of about 24 cm. Confluent associated mesenteric inflammation. Regional small volume free fluid. No extraluminal gas. No diverticula in the region. But there is mild diverticulosis just upstream in the  distal descending colon. That segment is decompressed and does not appear inflamed. There is trace free fluid in the left gutter. Redundant but partially decompressed transverse colon. Redundant right colon with mild retained stool. Normal appendix on series 3, image 49. Decompressed and negative terminal ileum. No dilated small bowel. Stomach and duodenum are within normal limits. No free air. Vascular/Lymphatic: Aortoiliac calcified atherosclerosis. Major arterial structures are patent including the IMA. Portal venous system is patent. No lymphadenopathy. Reproductive: Surrounded by a small volume of simple density free fluid. Otherwise negative. Other: Small volume of simple density free fluid in the bilateral pelvis. Musculoskeletal: Osteopenia. Chronic grade 1 anterolisthesis of L5 on S1 with associated facet arthropathy. No acute osseous abnormality identified. IMPRESSION: 1. Positive for Moderate Acute Sigmoid Colitis. Inflamed roughly 24 cm segment of sigmoid colon with associated small volume of free fluid in the abdomen and pelvis. But no  abscess, perforation, or other complicating features. 2. Mild aortic atherosclerosis. Electronically Signed   By: Odessa Fleming M.D.   On: 08/22/2020 07:29   DG CHEST PORT 1 VIEW  Result Date: 08/22/2020 CLINICAL DATA:  Colitis EXAM: PORTABLE CHEST 1 VIEW COMPARISON:  None. FINDINGS: Cardiomegaly. Lower lobe airspace opacities, likely atelectasis. No visible effusions or acute bony abnormality. IMPRESSION: Low lung volumes with bibasilar atelectasis.  Mild cardiomegaly. Electronically Signed   By: Charlett Nose M.D.   On: 08/22/2020 16:59    Impression/Plan: -Acute onset of diarrhea yesterday with CT scan showing long segment thickening of sigmoid colon.  Concerning for acute colitis. Colonoscopy in September 2016 for evaluation of colitis was normal.  Recommendations ------------------------- -Patient's diarrhea has already improved on antibiotics. -Advance diet to soft and change antibiotics to oral if tolerating diet. -Recommend to continue antibiotics for total 2 weeks. -Avoid NSAIDs -Recommend outpatient colonoscopy once acute issues are resolved -No further inpatient GI work-up planned.  GI will sign off.  Follow-up in GI clinic in 4 to 6 weeks.     LOS: 1 day   Kathi Der  MD, FACP 08/23/2020, 9:36 AM  Contact #  [phone]

## 2020-08-24 LAB — BASIC METABOLIC PANEL
Anion gap: 4 — ABNORMAL LOW (ref 5–15)
BUN: 10 mg/dL (ref 8–23)
CO2: 26 mmol/L (ref 22–32)
Calcium: 8.5 mg/dL — ABNORMAL LOW (ref 8.9–10.3)
Chloride: 111 mmol/L (ref 98–111)
Creatinine, Ser: 1.11 mg/dL — ABNORMAL HIGH (ref 0.44–1.00)
GFR, Estimated: 56 mL/min — ABNORMAL LOW (ref >60)
Glucose, Bld: 106 mg/dL — ABNORMAL HIGH (ref 70–99)
Potassium: 3.9 mmol/L (ref 3.5–5.1)
Sodium: 141 mmol/L (ref 135–145)

## 2020-08-24 LAB — MAGNESIUM: Magnesium: 1.6 mg/dL — ABNORMAL LOW (ref 1.7–2.4)

## 2020-08-24 MED ORDER — MAGNESIUM SULFATE 2 GM/50ML IV SOLN
2.0000 g | Freq: Once | INTRAVENOUS | Status: AC
  Administered 2020-08-24: 2 g via INTRAVENOUS
  Filled 2020-08-24: qty 50

## 2020-08-24 MED ORDER — AMOXICILLIN-POT CLAVULANATE 875-125 MG PO TABS: 1.0000 | ORAL_TABLET | Freq: Two times a day (BID) | ORAL | 0 refills | Status: AC

## 2020-08-24 NOTE — Discharge Summary (Signed)
Physician Discharge Summary  Michelle Tapia UJW:[id number] DOB: March 18, 1960 DOA: 08/21/2020  PCP: Mila Palmer, MD  Admit date: 08/21/2020 Discharge date: 08/24/2020  Admitted From: Home Disposition:  Home  Recommendations for Outpatient Follow-up:  Follow up with PCP in 1 week Follow up with GI in 4-6 weeks   Discharge Condition: Stable CODE STATUS: Full  Diet recommendation:  Diet Orders (From admission, onward)     Start     Ordered   08/23/20 0959  DIET SOFT Room service appropriate? Yes; Fluid consistency: Thin  Diet effective now       Question Answer Comment  Room service appropriate? Yes   Fluid consistency: Thin      08/23/20 5621           Brief/Interim Summary: Michelle Tapia is a 60 y.o. female with medical history significant of hypertension, rheumatoid arthritis, GERD, and history of ischemic colitis presents with complaints of lower abdominal pain which started yesterday evening around 5 PM yesterday.  Pain was initially noted to be sharp in nature and initially was across her whole lower abdomen.  Pain now at this time is mostly located in the lower left quadrant of her abdomen with what she describes as a continued soreness.  Noted associated symptoms of nausea, dysuria, headache, and several episodes of nonbloody diarrhea.  CT scan of the abdomen and pelvis with contrast significant for moderate acute sigmoid colitis with small volume of free fluid in abdomen without signs of abscess or perforation. GI was consulted.  Patient's diet was advanced and her pain continued to improve.  Antibiotics de-escalated to p.o. for discharge.  Discharge Diagnoses:  Principal Problem:   Colitis Active Problems:   Benign essential HTN   RA (rheumatoid arthritis) (HCC)   Hypokalemia   Renal insufficiency   Migraine headache   Acute sigmoid colitis -Appreciate GI.  Follow-up as outpatient in 4 to 6 weeks for outpatient colonoscopy once acute issues are  resolved -Tolerating soft diet -Rocephin, Flagyl --> Augmentin.  Recommended to continue antibiotics for total 2 weeks   Hypomagnesemia -Replaced   Essential hypertension -Resume lisinopril-HCTZ    CKD stage IIIa -Stable   Rheumatoid arthritis -Continue Plaquenil  Migraine -Continue amitriptyline  GERD -Continue Protonix   Discharge Instructions  Discharge Instructions     Call MD for:  difficulty breathing, headache or visual disturbances   Complete by: As directed    Call MD for:  extreme fatigue   Complete by: As directed    Call MD for:  persistant dizziness or light-headedness   Complete by: As directed    Call MD for:  persistant nausea and vomiting   Complete by: As directed    Call MD for:  severe uncontrolled pain   Complete by: As directed    Call MD for:  temperature >100.4   Complete by: As directed    Discharge instructions   Complete by: As directed    You were cared for by a hospitalist during your hospital stay. If you have any questions about your discharge medications or the care you received while you were in the hospital after you are discharged, you can call the unit and ask to speak with the hospitalist on call if the hospitalist that took care of you is not available. Once you are discharged, your primary care physician will handle any further medical issues. Please note that NO REFILLS for any discharge medications will be authorized once you are discharged, as it is imperative that  you return to your primary care physician (or establish a relationship with a primary care physician if you do not have one) for your aftercare needs so that they can reassess your need for medications and monitor your lab values.   Increase activity slowly   Complete by: As directed       Allergies as of 08/24/2020   No Known Allergies      Medication List     STOP taking these medications    polyethylene glycol 17 g packet Commonly known as: MIRALAX /  GLYCOLAX       TAKE these medications    amitriptyline 25 MG tablet Commonly known as: ELAVIL Take 1-2 tablets by mouth daily.   amoxicillin-clavulanate 875-125 MG tablet Commonly known as: Augmentin Take 1 tablet by mouth 2 (two) times daily for 12 days.   aspirin EC 81 MG tablet Take 81 mg by mouth daily.   Calcium 500 + D 500-125 MG-UNIT Tabs Generic drug: Calcium Carb-Cholecalciferol Take 1 tablet by mouth daily at 6 (six) AM.   FISH OIL PO Take 1 capsule by mouth daily.   hydroxychloroquine 200 MG tablet Commonly known as: PLAQUENIL Take 400 mg by mouth daily.   lisinopril-hydrochlorothiazide 10-12.5 MG tablet Commonly known as: ZESTORETIC Take 1 tablet by mouth daily.   omeprazole 20 MG capsule Commonly known as: PRILOSEC Take 20 mg by mouth 2 (two) times daily before a meal.        Follow-up Information     [facility] up in 6 week(s).   Why: Follow-up on colitis Contact information: [address] [place] [address] [phone]         Mila Palmer, MD. Schedule an appointment as soon as possible for a visit in 1 week(s).   Specialty: Family Medicine Contact information: [address] [place] [address] [phone]                No Known Allergies  Consultations: GI    Procedures/Studies: CT Abdomen Pelvis W Contrast  Result Date: 08/22/2020 CLINICAL DATA:  60 year old female with left lower quadrant abdominal pain. EXAM: CT ABDOMEN AND PELVIS WITH CONTRAST TECHNIQUE: Multidetector CT imaging of the abdomen and pelvis was performed using the standard protocol following bolus administration of intravenous contrast. CONTRAST:  49mL OMNIPAQUE IOHEXOL 300 MG/ML  SOLN COMPARISON:  CT Abdomen and Pelvis 02/12/2016 and earlier. FINDINGS: Lower chest: Improved atelectasis at the lung bases. Borderline to mild cardiomegaly. No pericardial or pleural effusion. 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 There is trace perihepatic free fluid. Otherwise negative liver and gallbladder. Pancreas: Negative. Spleen: Negative. Adrenals/Urinary Tract: Normal adrenal glands. Kidneys are nonobstructed and stable since 2017. Symmetric renal enhancement and contrast excretion. Decompressed ureters. Bladder remains within normal limits. Stomach/Bowel: Rectum is within normal limits. Sigmoid colon is thick-walled and inflamed from the junction with the descending to the junction with the rectum, involving a segment of about 24 cm. Confluent associated mesenteric inflammation. Regional small volume free fluid. No extraluminal gas. No diverticula in the region. But there is mild diverticulosis just upstream in the distal descending colon. That segment is decompressed and does not appear inflamed. There is trace free fluid in the left gutter. Redundant but partially decompressed transverse colon. Redundant right colon with mild retained stool. Normal appendix on series 3, image 49. Decompressed and negative terminal ileum. No dilated small bowel. Stomach and duodenum are within normal limits. No  free air. Vascular/Lymphatic: Aortoiliac calcified atherosclerosis. Major arterial structures are patent including the IMA. Portal venous system is patent. No lymphadenopathy. Reproductive: Surrounded by a small volume of simple density free fluid. Otherwise negative. Other: Small volume of simple density free fluid in the bilateral pelvis. Musculoskeletal: Osteopenia. Chronic grade 1 anterolisthesis of L5 on S1 with associated facet arthropathy. No acute osseous abnormality identified. IMPRESSION: 1. Positive for Moderate Acute Sigmoid Colitis. Inflamed roughly 24 cm segment of sigmoid colon with associated small volume of free fluid in the abdomen and pelvis. But no abscess, perforation, or other complicating features. 2. Mild aortic atherosclerosis. Electronically Signed   By: Odessa Fleming  M.D.   On: 08/22/2020 07:29   DG CHEST PORT 1 VIEW  Result Date: 08/22/2020 CLINICAL DATA:  Colitis EXAM: PORTABLE CHEST 1 VIEW COMPARISON:  None. FINDINGS: Cardiomegaly. Lower lobe airspace opacities, likely atelectasis. No visible effusions or acute bony abnormality. IMPRESSION: Low lung volumes with bibasilar atelectasis.  Mild cardiomegaly. Electronically Signed   By: Charlett Nose M.D.   On: 08/22/2020 16:59       Discharge Exam: Vitals:   08/24/20 0441 08/24/20 0800  BP: 124/72 115/79  Pulse: 74 75  Resp: 16 15  Temp: 98.5 F (36.9 C) 98.5 F (36.9 C)  SpO2: 100% 99%    General: Pt is alert, awake, not in acute distress Cardiovascular: RRR, S1/S2 +, no edema Respiratory: CTA bilaterally, no wheezing, no rhonchi, no respiratory distress, no conversational dyspnea  Abdominal: Soft, mildly TTP LLQ, ND, bowel sounds + Extremities: no edema, no cyanosis Psych: Normal mood and affect, stable judgement and insight     The results of significant diagnostics from this hospitalization (including imaging, microbiology, ancillary and laboratory) are listed below for reference.     Microbiology: Recent Results (from the past 240 hour(s))  SARS CORONAVIRUS 2 (TAT 6-24 HRS) Nasopharyngeal Nasopharyngeal Swab     Status: None   Collection Time: 08/22/20  2:29 PM   Specimen: Nasopharyngeal Swab  Result Value Ref Range Status   SARS Coronavirus 2 NEGATIVE NEGATIVE Final    Comment: (NOTE) SARS-CoV-2 target nucleic acids are NOT DETECTED.  The SARS-CoV-2 RNA is generally detectable in upper and lower respiratory specimens during the acute phase of infection. Negative results do not preclude SARS-CoV-2 infection, do not rule out co-infections with other pathogens, and should not be used as the sole basis for treatment or other patient management decisions. Negative results must be combined with clinical observations, patient history, and epidemiological information. The  expected result is Negative.  Fact Sheet for Patients: HairSlick.no  Fact Sheet for Healthcare Providers: quierodirigir.com  This test is not yet approved or cleared by the [place] FDA and  has been authorized for detection and/or diagnosis of SARS-CoV-2 by FDA under an Emergency Use Authorization (EUA). This EUA will remain  in effect (meaning this test can be used) for the duration of the COVID-19 declaration under Se ction 564(b)(1) of the Act, 21 U.S.C. section 360bbb-3(b)(1), unless the authorization is terminated or revoked sooner.  Performed at [facility], [address], [place], [place] [address]      Labs: BNP (last 3 results) No results for input(s): BNP in the last 8760 hours. Basic Metabolic Panel: Recent Labs  Lab 08/21/20 2107 08/23/20 0306 08/24/20 0245  NA 139 139 141  K 3.3* 3.2* 3.9  CL 103 106 111  CO2 26 27 26   GLUCOSE 123* 98 106*  BUN 9 7* 10  CREATININE 1.27* 1.13* 1.11*  CALCIUM 9.4 8.5* 8.5*  MG  --  1.7 1.6*   Liver Function Tests: Recent Labs  Lab 08/21/20 2107  AST 28  ALT 22  ALKPHOS 74  BILITOT 0.4  PROT 7.2  ALBUMIN 4.1   Recent Labs  Lab 08/21/20 2107  LIPASE 33   No results for input(s): AMMONIA in the last 168 hours. CBC: Recent Labs  Lab 08/21/20 2107 08/23/20 0306  WBC 8.2 6.8  NEUTROABS 6.6  --   HGB 12.7 11.3*  HCT 38.5 34.1*  MCV 87.3 87.2  PLT 248 197   Cardiac Enzymes: No results for input(s): CKTOTAL, CKMB, CKMBINDEX, TROPONINI in the last 168 hours. BNP: Invalid input(s): POCBNP CBG: No results for input(s): GLUCAP in the last 168 hours. D-Dimer No results for input(s): DDIMER in the last 72 hours. Hgb A1c No results for input(s): HGBA1C in the last 72 hours. Lipid Profile No results for input(s): CHOL, HDL, LDLCALC, TRIG, CHOLHDL, LDLDIRECT in the last 72 hours. Thyroid function studies No results for input(s): TSH, T4TOTAL,  T3FREE, THYROIDAB in the last 72 hours.  Invalid input(s): FREET3 Anemia work up No results for input(s): VITAMINB12, FOLATE, FERRITIN, TIBC, IRON, RETICCTPCT in the last 72 hours. Urinalysis    Component Value Date/Time   COLORURINE YELLOW 08/21/2020 2046   APPEARANCEUR CLEAR 08/21/2020 2046   LABSPEC 1.013 08/21/2020 2046   PHURINE 5.0 08/21/2020 2046   GLUCOSEU NEGATIVE 08/21/2020 2046   HGBUR NEGATIVE 08/21/2020 2046   BILIRUBINUR NEGATIVE 08/21/2020 2046   KETONESUR NEGATIVE 08/21/2020 2046   PROTEINUR NEGATIVE 08/21/2020 2046   UROBILINOGEN 0.2 08/16/2014 2352   NITRITE NEGATIVE 08/21/2020 2046   LEUKOCYTESUR LARGE (A) 08/21/2020 2046   Sepsis Labs Invalid input(s): PROCALCITONIN,  WBC,  LACTICIDVEN Microbiology Recent Results (from the past 240 hour(s))  SARS CORONAVIRUS 2 (TAT 6-24 HRS) Nasopharyngeal Nasopharyngeal Swab     Status: None   Collection Time: 08/22/20  2:29 PM   Specimen: Nasopharyngeal Swab  Result Value Ref Range Status   SARS Coronavirus 2 NEGATIVE NEGATIVE Final    Comment: (NOTE) SARS-CoV-2 target nucleic acids are NOT DETECTED.  The SARS-CoV-2 RNA is generally detectable in upper and lower respiratory specimens during the acute phase of infection. Negative results do not preclude SARS-CoV-2 infection, do not rule out co-infections with other pathogens, and should not be used as the sole basis for treatment or other patient management decisions. Negative results must be combined with clinical observations, patient history, and epidemiological information. The expected result is Negative.  Fact Sheet for Patients: HairSlick.no  Fact Sheet for Healthcare Providers: quierodirigir.com  This test is not yet approved or cleared by the [place] FDA and  has been authorized for detection and/or diagnosis of SARS-CoV-2 by FDA under an Emergency Use Authorization (EUA). This EUA will remain   in effect (meaning this test can be used) for the duration of the COVID-19 declaration under Se ction 564(b)(1) of the Act, 21 U.S.C. section 360bbb-3(b)(1), unless the authorization is terminated or revoked sooner.  Performed at [facility], [address] [place], [place] [address]      Patient was seen and examined on the day of discharge and was found to be in stable condition. Time coordinating discharge: 25 minutes including assessment and coordination of care, as well as examination of the patient.   SIGNED:  Noralee Stain, DO [facility] 08/24/2020, 10:50 AM

## 2020-08-29 DIAGNOSIS — K5732 Diverticulitis of large intestine without perforation or abscess without bleeding: Secondary | ICD-10-CM | POA: Diagnosis not present

## 2020-10-04 ENCOUNTER — Other Ambulatory Visit: Payer: Self-pay | Admitting: Family Medicine

## 2020-10-04 DIAGNOSIS — R933 Abnormal findings on diagnostic imaging of other parts of digestive tract: Secondary | ICD-10-CM | POA: Diagnosis not present

## 2020-10-04 DIAGNOSIS — K219 Gastro-esophageal reflux disease without esophagitis: Secondary | ICD-10-CM | POA: Diagnosis not present

## 2020-10-04 DIAGNOSIS — Z1231 Encounter for screening mammogram for malignant neoplasm of breast: Secondary | ICD-10-CM

## 2020-10-04 DIAGNOSIS — K625 Hemorrhage of anus and rectum: Secondary | ICD-10-CM | POA: Diagnosis not present

## 2020-10-24 DIAGNOSIS — K648 Other hemorrhoids: Secondary | ICD-10-CM | POA: Diagnosis not present

## 2020-10-24 DIAGNOSIS — K5289 Other specified noninfective gastroenteritis and colitis: Secondary | ICD-10-CM | POA: Diagnosis not present

## 2020-10-24 DIAGNOSIS — K5732 Diverticulitis of large intestine without perforation or abscess without bleeding: Secondary | ICD-10-CM | POA: Diagnosis not present

## 2020-10-24 DIAGNOSIS — K644 Residual hemorrhoidal skin tags: Secondary | ICD-10-CM | POA: Diagnosis not present

## 2020-10-25 ENCOUNTER — Ambulatory Visit: Payer: Medicare Other

## 2020-10-26 DIAGNOSIS — R519 Headache, unspecified: Secondary | ICD-10-CM | POA: Diagnosis not present

## 2020-10-26 DIAGNOSIS — Z03818 Encounter for observation for suspected exposure to other biological agents ruled out: Secondary | ICD-10-CM | POA: Diagnosis not present

## 2020-10-26 DIAGNOSIS — R0981 Nasal congestion: Secondary | ICD-10-CM | POA: Diagnosis not present

## 2020-10-26 DIAGNOSIS — R051 Acute cough: Secondary | ICD-10-CM | POA: Diagnosis not present

## 2020-10-31 DIAGNOSIS — K5289 Other specified noninfective gastroenteritis and colitis: Secondary | ICD-10-CM | POA: Diagnosis not present

## 2020-11-21 ENCOUNTER — Ambulatory Visit: Payer: Medicare Other

## 2020-11-21 ENCOUNTER — Other Ambulatory Visit: Payer: Self-pay

## 2020-11-21 ENCOUNTER — Ambulatory Visit
Admission: RE | Admit: 2020-11-21 | Discharge: 2020-11-21 | Disposition: A | Discharge status: Home / Self Care | Payer: Medicare Other | Source: Ambulatory Visit | Attending: Family Medicine | Admitting: Family Medicine

## 2020-11-21 DIAGNOSIS — Z1231 Encounter for screening mammogram for malignant neoplasm of breast: Secondary | ICD-10-CM | POA: Diagnosis not present

## 2020-11-21 DIAGNOSIS — I10 Essential (primary) hypertension: Secondary | ICD-10-CM | POA: Diagnosis not present

## 2020-11-21 DIAGNOSIS — N182 Chronic kidney disease, stage 2 (mild): Secondary | ICD-10-CM | POA: Diagnosis not present

## 2020-11-21 DIAGNOSIS — K219 Gastro-esophageal reflux disease without esophagitis: Secondary | ICD-10-CM | POA: Diagnosis not present

## 2020-11-21 DIAGNOSIS — I129 Hypertensive chronic kidney disease with stage 1 through stage 4 chronic kidney disease, or unspecified chronic kidney disease: Secondary | ICD-10-CM | POA: Diagnosis not present

## 2020-11-21 DIAGNOSIS — M069 Rheumatoid arthritis, unspecified: Secondary | ICD-10-CM | POA: Diagnosis not present

## 2020-11-21 DIAGNOSIS — E7800 Pure hypercholesterolemia, unspecified: Secondary | ICD-10-CM | POA: Diagnosis not present

## 2020-12-05 DIAGNOSIS — H04123 Dry eye syndrome of bilateral lacrimal glands: Secondary | ICD-10-CM | POA: Diagnosis not present

## 2020-12-05 DIAGNOSIS — H5213 Myopia, bilateral: Secondary | ICD-10-CM | POA: Diagnosis not present

## 2020-12-05 DIAGNOSIS — H1045 Other chronic allergic conjunctivitis: Secondary | ICD-10-CM | POA: Diagnosis not present

## 2020-12-05 DIAGNOSIS — H2513 Age-related nuclear cataract, bilateral: Secondary | ICD-10-CM | POA: Diagnosis not present

## 2020-12-05 DIAGNOSIS — Z79899 Other long term (current) drug therapy: Secondary | ICD-10-CM | POA: Diagnosis not present

## 2020-12-05 DIAGNOSIS — M069 Rheumatoid arthritis, unspecified: Secondary | ICD-10-CM | POA: Diagnosis not present

## 2020-12-12 DIAGNOSIS — M0579 Rheumatoid arthritis with rheumatoid factor of multiple sites without organ or systems involvement: Secondary | ICD-10-CM | POA: Diagnosis not present

## 2020-12-12 DIAGNOSIS — M2550 Pain in unspecified joint: Secondary | ICD-10-CM | POA: Diagnosis not present

## 2020-12-12 DIAGNOSIS — Z79899 Other long term (current) drug therapy: Secondary | ICD-10-CM | POA: Diagnosis not present

## 2020-12-12 DIAGNOSIS — K5180 Other ulcerative colitis without complications: Secondary | ICD-10-CM | POA: Diagnosis not present

## 2020-12-12 DIAGNOSIS — M5136 Other intervertebral disc degeneration, lumbar region: Secondary | ICD-10-CM | POA: Diagnosis not present

## 2020-12-12 DIAGNOSIS — M79642 Pain in left hand: Secondary | ICD-10-CM | POA: Diagnosis not present

## 2020-12-12 DIAGNOSIS — M79641 Pain in right hand: Secondary | ICD-10-CM | POA: Diagnosis not present

## 2020-12-26 DIAGNOSIS — K5909 Other constipation: Secondary | ICD-10-CM | POA: Diagnosis not present

## 2020-12-26 DIAGNOSIS — I129 Hypertensive chronic kidney disease with stage 1 through stage 4 chronic kidney disease, or unspecified chronic kidney disease: Secondary | ICD-10-CM | POA: Diagnosis not present

## 2020-12-26 DIAGNOSIS — G43909 Migraine, unspecified, not intractable, without status migrainosus: Secondary | ICD-10-CM | POA: Diagnosis not present

## 2020-12-26 DIAGNOSIS — I700 Atherosclerosis of aorta: Secondary | ICD-10-CM | POA: Diagnosis not present

## 2020-12-26 DIAGNOSIS — M4696 Unspecified inflammatory spondylopathy, lumbar region: Secondary | ICD-10-CM | POA: Diagnosis not present

## 2020-12-26 DIAGNOSIS — N182 Chronic kidney disease, stage 2 (mild): Secondary | ICD-10-CM | POA: Diagnosis not present

## 2020-12-26 DIAGNOSIS — Z0000 Encounter for general adult medical examination without abnormal findings: Secondary | ICD-10-CM | POA: Diagnosis not present

## 2020-12-26 DIAGNOSIS — E559 Vitamin D deficiency, unspecified: Secondary | ICD-10-CM | POA: Diagnosis not present

## 2020-12-26 DIAGNOSIS — M069 Rheumatoid arthritis, unspecified: Secondary | ICD-10-CM | POA: Diagnosis not present

## 2020-12-26 DIAGNOSIS — I10 Essential (primary) hypertension: Secondary | ICD-10-CM | POA: Diagnosis not present

## 2020-12-26 DIAGNOSIS — Z79899 Other long term (current) drug therapy: Secondary | ICD-10-CM | POA: Diagnosis not present

## 2020-12-28 ENCOUNTER — Other Ambulatory Visit: Payer: Self-pay | Admitting: Family Medicine

## 2020-12-28 DIAGNOSIS — E2839 Other primary ovarian failure: Secondary | ICD-10-CM

## 2021-01-10 DIAGNOSIS — N182 Chronic kidney disease, stage 2 (mild): Secondary | ICD-10-CM | POA: Diagnosis not present

## 2021-01-10 DIAGNOSIS — I10 Essential (primary) hypertension: Secondary | ICD-10-CM | POA: Diagnosis not present

## 2021-01-10 DIAGNOSIS — E7800 Pure hypercholesterolemia, unspecified: Secondary | ICD-10-CM | POA: Diagnosis not present

## 2021-01-10 DIAGNOSIS — M069 Rheumatoid arthritis, unspecified: Secondary | ICD-10-CM | POA: Diagnosis not present

## 2021-01-10 DIAGNOSIS — K219 Gastro-esophageal reflux disease without esophagitis: Secondary | ICD-10-CM | POA: Diagnosis not present

## 2021-01-10 DIAGNOSIS — I129 Hypertensive chronic kidney disease with stage 1 through stage 4 chronic kidney disease, or unspecified chronic kidney disease: Secondary | ICD-10-CM | POA: Diagnosis not present

## 2021-02-26 DIAGNOSIS — I10 Essential (primary) hypertension: Secondary | ICD-10-CM | POA: Diagnosis not present

## 2021-02-26 DIAGNOSIS — M069 Rheumatoid arthritis, unspecified: Secondary | ICD-10-CM | POA: Diagnosis not present

## 2021-02-26 DIAGNOSIS — E7800 Pure hypercholesterolemia, unspecified: Secondary | ICD-10-CM | POA: Diagnosis not present

## 2021-02-26 DIAGNOSIS — K219 Gastro-esophageal reflux disease without esophagitis: Secondary | ICD-10-CM | POA: Diagnosis not present

## 2021-03-18 DIAGNOSIS — J038 Acute tonsillitis due to other specified organisms: Secondary | ICD-10-CM | POA: Diagnosis not present

## 2021-04-09 DIAGNOSIS — I10 Essential (primary) hypertension: Secondary | ICD-10-CM | POA: Diagnosis not present

## 2021-04-09 DIAGNOSIS — E7800 Pure hypercholesterolemia, unspecified: Secondary | ICD-10-CM | POA: Diagnosis not present

## 2021-04-09 DIAGNOSIS — K219 Gastro-esophageal reflux disease without esophagitis: Secondary | ICD-10-CM | POA: Diagnosis not present

## 2021-04-09 DIAGNOSIS — M069 Rheumatoid arthritis, unspecified: Secondary | ICD-10-CM | POA: Diagnosis not present

## 2021-04-24 DIAGNOSIS — J069 Acute upper respiratory infection, unspecified: Secondary | ICD-10-CM | POA: Diagnosis not present

## 2021-05-08 DIAGNOSIS — E7800 Pure hypercholesterolemia, unspecified: Secondary | ICD-10-CM | POA: Diagnosis not present

## 2021-05-08 DIAGNOSIS — M069 Rheumatoid arthritis, unspecified: Secondary | ICD-10-CM | POA: Diagnosis not present

## 2021-05-08 DIAGNOSIS — I10 Essential (primary) hypertension: Secondary | ICD-10-CM | POA: Diagnosis not present

## 2021-05-08 DIAGNOSIS — K219 Gastro-esophageal reflux disease without esophagitis: Secondary | ICD-10-CM | POA: Diagnosis not present

## 2021-05-16 DIAGNOSIS — M069 Rheumatoid arthritis, unspecified: Secondary | ICD-10-CM | POA: Diagnosis not present

## 2021-05-16 DIAGNOSIS — I10 Essential (primary) hypertension: Secondary | ICD-10-CM | POA: Diagnosis not present

## 2021-05-16 DIAGNOSIS — E7800 Pure hypercholesterolemia, unspecified: Secondary | ICD-10-CM | POA: Diagnosis not present

## 2021-05-16 DIAGNOSIS — K219 Gastro-esophageal reflux disease without esophagitis: Secondary | ICD-10-CM | POA: Diagnosis not present

## 2021-06-05 DIAGNOSIS — Z79899 Other long term (current) drug therapy: Secondary | ICD-10-CM | POA: Diagnosis not present

## 2021-06-05 DIAGNOSIS — K5180 Other ulcerative colitis without complications: Secondary | ICD-10-CM | POA: Diagnosis not present

## 2021-06-05 DIAGNOSIS — M5136 Other intervertebral disc degeneration, lumbar region: Secondary | ICD-10-CM | POA: Diagnosis not present

## 2021-06-05 DIAGNOSIS — M0579 Rheumatoid arthritis with rheumatoid factor of multiple sites without organ or systems involvement: Secondary | ICD-10-CM | POA: Diagnosis not present

## 2021-07-06 DIAGNOSIS — K219 Gastro-esophageal reflux disease without esophagitis: Secondary | ICD-10-CM | POA: Diagnosis not present

## 2021-07-06 DIAGNOSIS — E7800 Pure hypercholesterolemia, unspecified: Secondary | ICD-10-CM | POA: Diagnosis not present

## 2021-07-06 DIAGNOSIS — M069 Rheumatoid arthritis, unspecified: Secondary | ICD-10-CM | POA: Diagnosis not present

## 2021-07-06 DIAGNOSIS — I129 Hypertensive chronic kidney disease with stage 1 through stage 4 chronic kidney disease, or unspecified chronic kidney disease: Secondary | ICD-10-CM | POA: Diagnosis not present

## 2021-07-06 DIAGNOSIS — I10 Essential (primary) hypertension: Secondary | ICD-10-CM | POA: Diagnosis not present

## 2021-07-19 DIAGNOSIS — E7800 Pure hypercholesterolemia, unspecified: Secondary | ICD-10-CM | POA: Diagnosis not present

## 2021-09-07 DIAGNOSIS — S92504A Nondisplaced unspecified fracture of right lesser toe(s), initial encounter for closed fracture: Secondary | ICD-10-CM | POA: Diagnosis not present

## 2021-09-07 DIAGNOSIS — S92524A Nondisplaced fracture of medial phalanx of right lesser toe(s), initial encounter for closed fracture: Secondary | ICD-10-CM | POA: Diagnosis not present

## 2021-09-26 DIAGNOSIS — U071 COVID-19: Secondary | ICD-10-CM | POA: Diagnosis not present

## 2021-09-26 DIAGNOSIS — R051 Acute cough: Secondary | ICD-10-CM | POA: Diagnosis not present

## 2021-09-26 DIAGNOSIS — M069 Rheumatoid arthritis, unspecified: Secondary | ICD-10-CM | POA: Diagnosis not present

## 2021-10-16 DIAGNOSIS — S92501A Displaced unspecified fracture of right lesser toe(s), initial encounter for closed fracture: Secondary | ICD-10-CM | POA: Diagnosis not present

## 2021-10-25 ENCOUNTER — Other Ambulatory Visit: Payer: Self-pay | Admitting: Family Medicine

## 2021-10-25 DIAGNOSIS — Z1231 Encounter for screening mammogram for malignant neoplasm of breast: Secondary | ICD-10-CM

## 2021-10-26 DIAGNOSIS — M533 Sacrococcygeal disorders, not elsewhere classified: Secondary | ICD-10-CM | POA: Diagnosis not present

## 2021-10-30 ENCOUNTER — Ambulatory Visit
Admission: RE | Admit: 2021-10-30 | Discharge: 2021-10-30 | Disposition: A | Discharge status: Home / Self Care | Payer: Medicare Other | Source: Ambulatory Visit | Attending: Family Medicine | Admitting: Family Medicine

## 2021-10-30 DIAGNOSIS — E2839 Other primary ovarian failure: Secondary | ICD-10-CM

## 2021-10-30 DIAGNOSIS — M810 Age-related osteoporosis without current pathological fracture: Secondary | ICD-10-CM | POA: Diagnosis not present

## 2021-10-30 DIAGNOSIS — Z780 Asymptomatic menopausal state: Secondary | ICD-10-CM | POA: Diagnosis not present

## 2021-10-30 DIAGNOSIS — M85851 Other specified disorders of bone density and structure, right thigh: Secondary | ICD-10-CM | POA: Diagnosis not present

## 2021-12-09 DIAGNOSIS — J069 Acute upper respiratory infection, unspecified: Secondary | ICD-10-CM | POA: Diagnosis not present

## 2021-12-09 DIAGNOSIS — H1012 Acute atopic conjunctivitis, left eye: Secondary | ICD-10-CM | POA: Diagnosis not present

## 2021-12-11 ENCOUNTER — Ambulatory Visit: Payer: Medicare Other

## 2021-12-24 DIAGNOSIS — M818 Other osteoporosis without current pathological fracture: Secondary | ICD-10-CM | POA: Diagnosis not present

## 2021-12-24 DIAGNOSIS — K219 Gastro-esophageal reflux disease without esophagitis: Secondary | ICD-10-CM | POA: Diagnosis not present

## 2021-12-24 DIAGNOSIS — I10 Essential (primary) hypertension: Secondary | ICD-10-CM | POA: Diagnosis not present

## 2021-12-24 DIAGNOSIS — M069 Rheumatoid arthritis, unspecified: Secondary | ICD-10-CM | POA: Diagnosis not present

## 2021-12-24 DIAGNOSIS — E7800 Pure hypercholesterolemia, unspecified: Secondary | ICD-10-CM | POA: Diagnosis not present

## 2021-12-26 DIAGNOSIS — Z79899 Other long term (current) drug therapy: Secondary | ICD-10-CM | POA: Diagnosis not present

## 2021-12-26 DIAGNOSIS — K5180 Other ulcerative colitis without complications: Secondary | ICD-10-CM | POA: Diagnosis not present

## 2021-12-26 DIAGNOSIS — M0579 Rheumatoid arthritis with rheumatoid factor of multiple sites without organ or systems involvement: Secondary | ICD-10-CM | POA: Diagnosis not present

## 2021-12-26 DIAGNOSIS — M810 Age-related osteoporosis without current pathological fracture: Secondary | ICD-10-CM | POA: Diagnosis not present

## 2021-12-26 DIAGNOSIS — M79641 Pain in right hand: Secondary | ICD-10-CM | POA: Diagnosis not present

## 2021-12-26 DIAGNOSIS — M5136 Other intervertebral disc degeneration, lumbar region: Secondary | ICD-10-CM | POA: Diagnosis not present

## 2022-01-11 ENCOUNTER — Ambulatory Visit
Admission: RE | Admit: 2022-01-11 | Discharge: 2022-01-11 | Disposition: A | Discharge status: Home / Self Care | Payer: Medicare Other | Source: Ambulatory Visit | Attending: Family Medicine | Admitting: Family Medicine

## 2022-01-11 DIAGNOSIS — Z1231 Encounter for screening mammogram for malignant neoplasm of breast: Secondary | ICD-10-CM

## 2022-02-04 DIAGNOSIS — M069 Rheumatoid arthritis, unspecified: Secondary | ICD-10-CM | POA: Diagnosis not present

## 2022-02-04 DIAGNOSIS — M818 Other osteoporosis without current pathological fracture: Secondary | ICD-10-CM | POA: Diagnosis not present

## 2022-02-04 DIAGNOSIS — E7800 Pure hypercholesterolemia, unspecified: Secondary | ICD-10-CM | POA: Diagnosis not present

## 2022-02-04 DIAGNOSIS — I10 Essential (primary) hypertension: Secondary | ICD-10-CM | POA: Diagnosis not present

## 2022-02-04 DIAGNOSIS — K219 Gastro-esophageal reflux disease without esophagitis: Secondary | ICD-10-CM | POA: Diagnosis not present

## 2022-02-07 ENCOUNTER — Other Ambulatory Visit (HOSPITAL_COMMUNITY)
Admission: RE | Admit: 2022-02-07 | Discharge: 2022-02-07 | Disposition: A | Discharge status: Home / Self Care | Payer: 59 | Source: Ambulatory Visit | Attending: Family Medicine | Admitting: Family Medicine

## 2022-02-07 DIAGNOSIS — Z1151 Encounter for screening for human papillomavirus (HPV): Secondary | ICD-10-CM | POA: Diagnosis not present

## 2022-02-07 DIAGNOSIS — Z01411 Encounter for gynecological examination (general) (routine) with abnormal findings: Secondary | ICD-10-CM | POA: Insufficient documentation

## 2022-02-07 DIAGNOSIS — K5909 Other constipation: Secondary | ICD-10-CM | POA: Diagnosis not present

## 2022-02-07 DIAGNOSIS — K219 Gastro-esophageal reflux disease without esophagitis: Secondary | ICD-10-CM | POA: Diagnosis not present

## 2022-02-07 DIAGNOSIS — I129 Hypertensive chronic kidney disease with stage 1 through stage 4 chronic kidney disease, or unspecified chronic kidney disease: Secondary | ICD-10-CM | POA: Diagnosis not present

## 2022-02-07 DIAGNOSIS — L409 Psoriasis, unspecified: Secondary | ICD-10-CM | POA: Diagnosis not present

## 2022-02-07 DIAGNOSIS — Z79899 Other long term (current) drug therapy: Secondary | ICD-10-CM | POA: Diagnosis not present

## 2022-02-07 DIAGNOSIS — Z0000 Encounter for general adult medical examination without abnormal findings: Secondary | ICD-10-CM | POA: Diagnosis not present

## 2022-02-07 DIAGNOSIS — E7800 Pure hypercholesterolemia, unspecified: Secondary | ICD-10-CM | POA: Diagnosis not present

## 2022-02-07 DIAGNOSIS — E559 Vitamin D deficiency, unspecified: Secondary | ICD-10-CM | POA: Diagnosis not present

## 2022-02-07 DIAGNOSIS — I10 Essential (primary) hypertension: Secondary | ICD-10-CM | POA: Diagnosis not present

## 2022-02-07 DIAGNOSIS — I700 Atherosclerosis of aorta: Secondary | ICD-10-CM | POA: Diagnosis not present

## 2022-02-07 DIAGNOSIS — G43909 Migraine, unspecified, not intractable, without status migrainosus: Secondary | ICD-10-CM | POA: Diagnosis not present

## 2022-02-07 DIAGNOSIS — M069 Rheumatoid arthritis, unspecified: Secondary | ICD-10-CM | POA: Diagnosis not present

## 2022-02-13 LAB — CYTOLOGY - PAP
Comment: NEGATIVE
Diagnosis: NEGATIVE
High risk HPV: NEGATIVE

## 2022-02-19 DIAGNOSIS — M0689 Other specified rheumatoid arthritis, multiple sites: Secondary | ICD-10-CM | POA: Diagnosis not present

## 2022-02-19 DIAGNOSIS — H2513 Age-related nuclear cataract, bilateral: Secondary | ICD-10-CM | POA: Diagnosis not present

## 2022-02-19 DIAGNOSIS — H04123 Dry eye syndrome of bilateral lacrimal glands: Secondary | ICD-10-CM | POA: Diagnosis not present

## 2022-02-19 DIAGNOSIS — Z79899 Other long term (current) drug therapy: Secondary | ICD-10-CM | POA: Diagnosis not present

## 2022-03-05 DIAGNOSIS — M069 Rheumatoid arthritis, unspecified: Secondary | ICD-10-CM | POA: Diagnosis not present

## 2022-03-05 DIAGNOSIS — E7800 Pure hypercholesterolemia, unspecified: Secondary | ICD-10-CM | POA: Diagnosis not present

## 2022-03-05 DIAGNOSIS — K219 Gastro-esophageal reflux disease without esophagitis: Secondary | ICD-10-CM | POA: Diagnosis not present

## 2022-03-05 DIAGNOSIS — N182 Chronic kidney disease, stage 2 (mild): Secondary | ICD-10-CM | POA: Diagnosis not present

## 2022-03-05 DIAGNOSIS — M818 Other osteoporosis without current pathological fracture: Secondary | ICD-10-CM | POA: Diagnosis not present

## 2022-03-05 DIAGNOSIS — I10 Essential (primary) hypertension: Secondary | ICD-10-CM | POA: Diagnosis not present

## 2022-03-27 DIAGNOSIS — K5180 Other ulcerative colitis without complications: Secondary | ICD-10-CM | POA: Diagnosis not present

## 2022-03-27 DIAGNOSIS — M5136 Other intervertebral disc degeneration, lumbar region: Secondary | ICD-10-CM | POA: Diagnosis not present

## 2022-03-27 DIAGNOSIS — M810 Age-related osteoporosis without current pathological fracture: Secondary | ICD-10-CM | POA: Diagnosis not present

## 2022-03-27 DIAGNOSIS — M7989 Other specified soft tissue disorders: Secondary | ICD-10-CM | POA: Diagnosis not present

## 2022-03-27 DIAGNOSIS — M0579 Rheumatoid arthritis with rheumatoid factor of multiple sites without organ or systems involvement: Secondary | ICD-10-CM | POA: Diagnosis not present

## 2022-03-27 DIAGNOSIS — M79641 Pain in right hand: Secondary | ICD-10-CM | POA: Diagnosis not present

## 2022-03-27 DIAGNOSIS — Z79899 Other long term (current) drug therapy: Secondary | ICD-10-CM | POA: Diagnosis not present

## 2022-04-23 DIAGNOSIS — Z20822 Contact with and (suspected) exposure to covid-19: Secondary | ICD-10-CM | POA: Diagnosis not present

## 2022-04-23 DIAGNOSIS — B349 Viral infection, unspecified: Secondary | ICD-10-CM | POA: Diagnosis not present

## 2022-04-23 DIAGNOSIS — R0981 Nasal congestion: Secondary | ICD-10-CM | POA: Diagnosis not present

## 2022-05-15 DIAGNOSIS — Z79899 Other long term (current) drug therapy: Secondary | ICD-10-CM | POA: Diagnosis not present

## 2022-05-15 DIAGNOSIS — K5180 Other ulcerative colitis without complications: Secondary | ICD-10-CM | POA: Diagnosis not present

## 2022-05-15 DIAGNOSIS — M0579 Rheumatoid arthritis with rheumatoid factor of multiple sites without organ or systems involvement: Secondary | ICD-10-CM | POA: Diagnosis not present

## 2022-05-15 DIAGNOSIS — M5136 Other intervertebral disc degeneration, lumbar region: Secondary | ICD-10-CM | POA: Diagnosis not present

## 2022-05-15 DIAGNOSIS — M79641 Pain in right hand: Secondary | ICD-10-CM | POA: Diagnosis not present

## 2022-05-15 DIAGNOSIS — M810 Age-related osteoporosis without current pathological fracture: Secondary | ICD-10-CM | POA: Diagnosis not present

## 2022-05-16 DIAGNOSIS — N182 Chronic kidney disease, stage 2 (mild): Secondary | ICD-10-CM | POA: Diagnosis not present

## 2022-09-12 DIAGNOSIS — Z79899 Other long term (current) drug therapy: Secondary | ICD-10-CM | POA: Diagnosis not present

## 2022-09-12 DIAGNOSIS — K5180 Other ulcerative colitis without complications: Secondary | ICD-10-CM | POA: Diagnosis not present

## 2022-09-12 DIAGNOSIS — M79641 Pain in right hand: Secondary | ICD-10-CM | POA: Diagnosis not present

## 2022-09-12 DIAGNOSIS — M810 Age-related osteoporosis without current pathological fracture: Secondary | ICD-10-CM | POA: Diagnosis not present

## 2022-09-12 DIAGNOSIS — M5136 Other intervertebral disc degeneration, lumbar region: Secondary | ICD-10-CM | POA: Diagnosis not present

## 2022-09-12 DIAGNOSIS — M0579 Rheumatoid arthritis with rheumatoid factor of multiple sites without organ or systems involvement: Secondary | ICD-10-CM | POA: Diagnosis not present

## 2022-10-17 DIAGNOSIS — L02419 Cutaneous abscess of limb, unspecified: Secondary | ICD-10-CM | POA: Diagnosis not present

## 2022-10-17 DIAGNOSIS — L089 Local infection of the skin and subcutaneous tissue, unspecified: Secondary | ICD-10-CM | POA: Diagnosis not present

## 2022-12-03 DIAGNOSIS — K5180 Other ulcerative colitis without complications: Secondary | ICD-10-CM | POA: Diagnosis not present

## 2022-12-03 DIAGNOSIS — M0579 Rheumatoid arthritis with rheumatoid factor of multiple sites without organ or systems involvement: Secondary | ICD-10-CM | POA: Diagnosis not present

## 2022-12-03 DIAGNOSIS — M79641 Pain in right hand: Secondary | ICD-10-CM | POA: Diagnosis not present

## 2022-12-03 DIAGNOSIS — Z79899 Other long term (current) drug therapy: Secondary | ICD-10-CM | POA: Diagnosis not present

## 2022-12-10 DIAGNOSIS — Z23 Encounter for immunization: Secondary | ICD-10-CM | POA: Diagnosis not present

## 2023-01-22 ENCOUNTER — Other Ambulatory Visit: Payer: Self-pay | Admitting: Family Medicine

## 2023-01-22 DIAGNOSIS — Z1231 Encounter for screening mammogram for malignant neoplasm of breast: Secondary | ICD-10-CM

## 2023-02-07 ENCOUNTER — Ambulatory Visit: Payer: 59

## 2023-02-10 ENCOUNTER — Ambulatory Visit
Admission: RE | Admit: 2023-02-10 | Discharge: 2023-02-10 | Disposition: A | Discharge status: Home / Self Care | Payer: 59 | Source: Ambulatory Visit | Attending: Family Medicine | Admitting: Family Medicine

## 2023-02-10 DIAGNOSIS — Z1231 Encounter for screening mammogram for malignant neoplasm of breast: Secondary | ICD-10-CM | POA: Diagnosis not present

## 2023-02-12 DIAGNOSIS — R051 Acute cough: Secondary | ICD-10-CM | POA: Diagnosis not present

## 2023-02-13 ENCOUNTER — Other Ambulatory Visit: Payer: Self-pay | Admitting: Family Medicine

## 2023-02-13 DIAGNOSIS — R928 Other abnormal and inconclusive findings on diagnostic imaging of breast: Secondary | ICD-10-CM

## 2023-03-11 DIAGNOSIS — M0579 Rheumatoid arthritis with rheumatoid factor of multiple sites without organ or systems involvement: Secondary | ICD-10-CM | POA: Diagnosis not present

## 2023-03-11 DIAGNOSIS — Z79899 Other long term (current) drug therapy: Secondary | ICD-10-CM | POA: Diagnosis not present

## 2023-03-12 ENCOUNTER — Ambulatory Visit
Admission: RE | Admit: 2023-03-12 | Discharge: 2023-03-12 | Disposition: A | Discharge status: Home / Self Care | Payer: 59 | Source: Ambulatory Visit | Attending: Family Medicine | Admitting: Family Medicine

## 2023-03-12 DIAGNOSIS — R928 Other abnormal and inconclusive findings on diagnostic imaging of breast: Secondary | ICD-10-CM

## 2023-03-26 DIAGNOSIS — N1831 Chronic kidney disease, stage 3a: Secondary | ICD-10-CM | POA: Diagnosis not present

## 2023-03-26 DIAGNOSIS — Z0000 Encounter for general adult medical examination without abnormal findings: Secondary | ICD-10-CM | POA: Diagnosis not present

## 2023-03-26 DIAGNOSIS — K219 Gastro-esophageal reflux disease without esophagitis: Secondary | ICD-10-CM | POA: Diagnosis not present

## 2023-03-26 DIAGNOSIS — E7800 Pure hypercholesterolemia, unspecified: Secondary | ICD-10-CM | POA: Diagnosis not present

## 2023-03-26 DIAGNOSIS — Z23 Encounter for immunization: Secondary | ICD-10-CM | POA: Diagnosis not present

## 2023-03-26 DIAGNOSIS — I10 Essential (primary) hypertension: Secondary | ICD-10-CM | POA: Diagnosis not present

## 2023-04-01 DIAGNOSIS — H04123 Dry eye syndrome of bilateral lacrimal glands: Secondary | ICD-10-CM | POA: Diagnosis not present

## 2023-04-01 DIAGNOSIS — M0689 Other specified rheumatoid arthritis, multiple sites: Secondary | ICD-10-CM | POA: Diagnosis not present

## 2023-04-01 DIAGNOSIS — Z79899 Other long term (current) drug therapy: Secondary | ICD-10-CM | POA: Diagnosis not present

## 2023-04-01 DIAGNOSIS — H2513 Age-related nuclear cataract, bilateral: Secondary | ICD-10-CM | POA: Diagnosis not present

## 2023-05-05 IMAGING — MG MM DIGITAL SCREENING BILAT W/ TOMO AND CAD
8 series · 8 of 24 positions shown · non-contrast
Comparison: Previous exam(s).

CLINICAL DATA: Screening.

EXAM:
DIGITAL SCREENING BILATERAL MAMMOGRAM WITH TOMOSYNTHESIS AND CAD
TECHNIQUE: Bilateral screening digital craniocaudal and mediolateral oblique
mammograms were obtained. Bilateral screening digital breast
tomosynthesis was performed. The images were evaluated with
computer-aided detection.

[L MLO synth-2D]
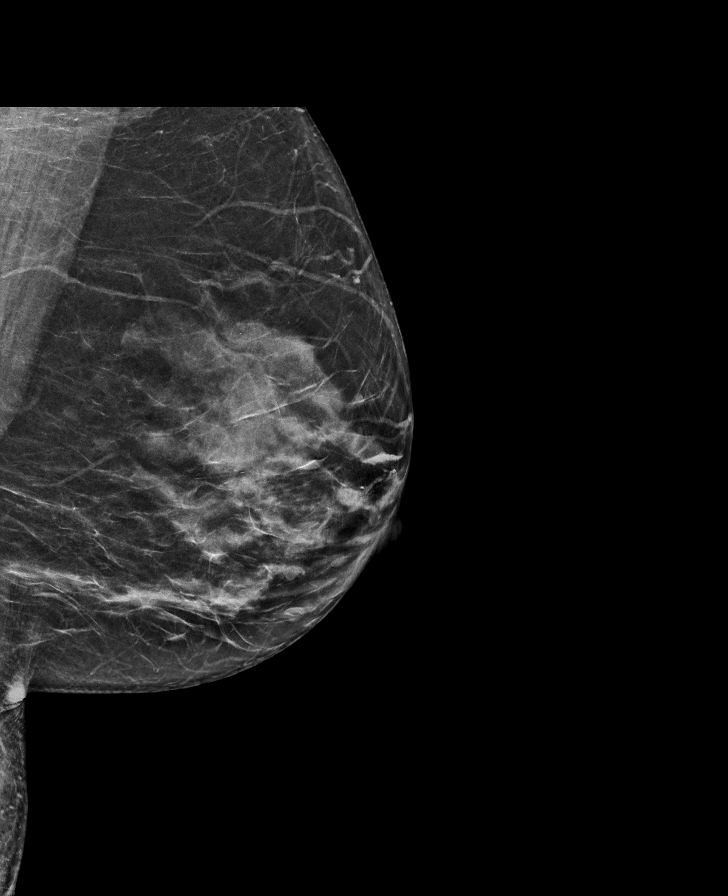

[R MLO synth-2D]
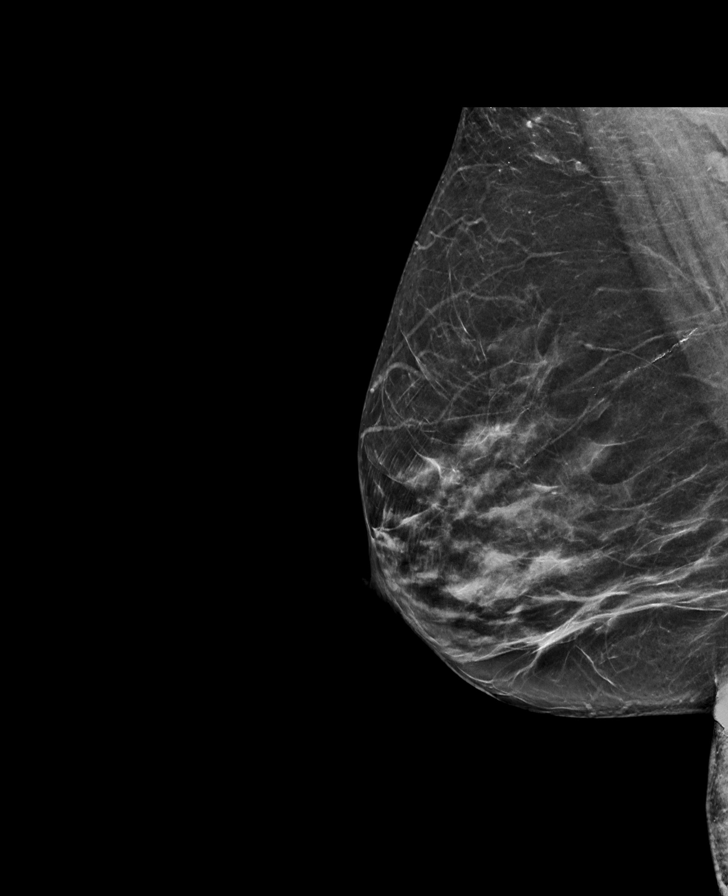

[R CC synth-2D]
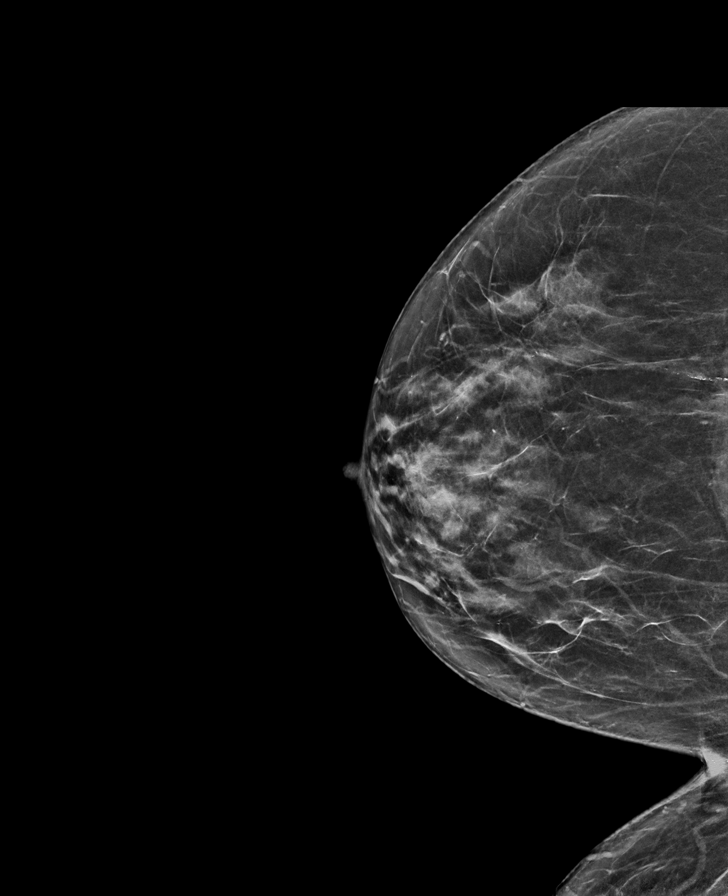

[L CC synth-2D]
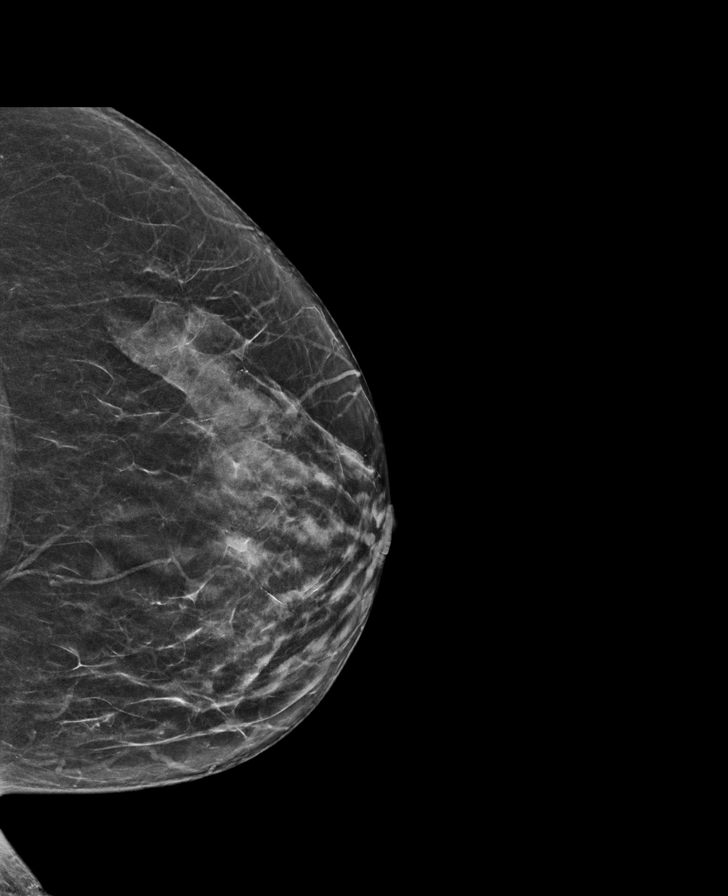

[L MLO tomo · tomo slice 35/68.0]
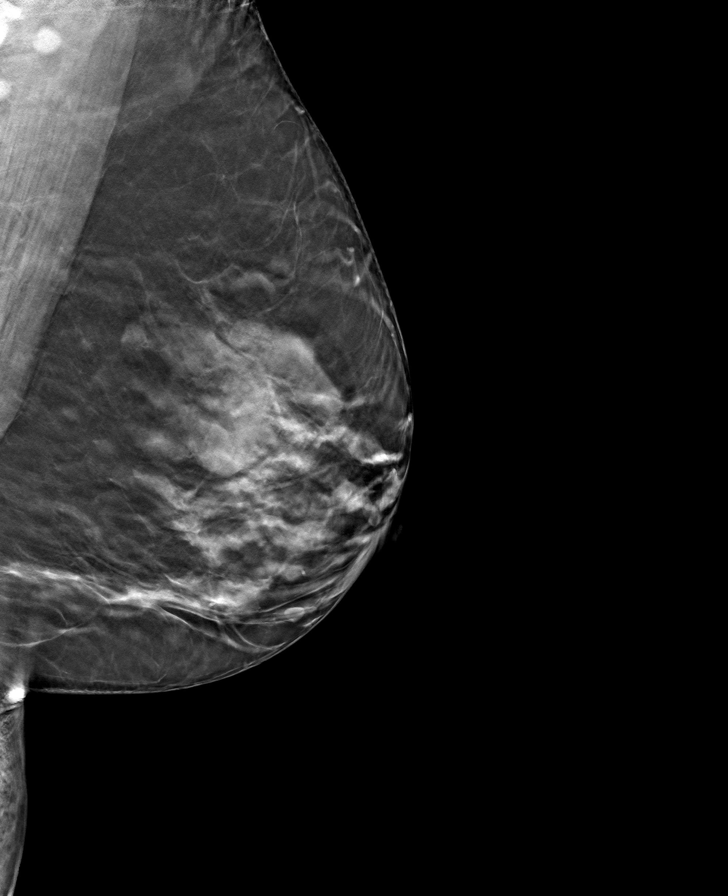

[L CC tomo · tomo slice 31/61.0]
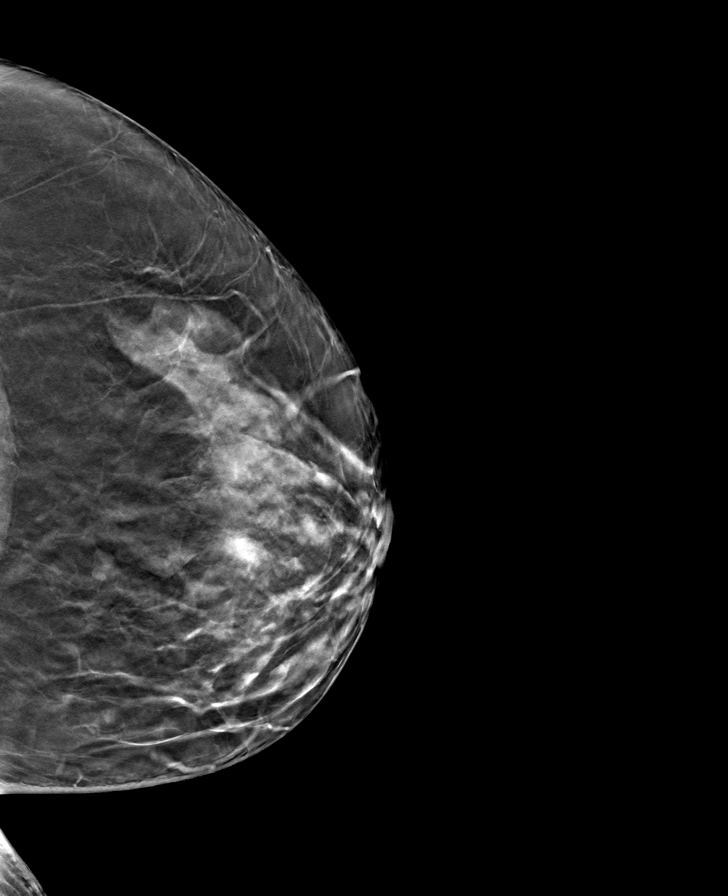

[R MLO tomo · tomo slice 35/70.0]
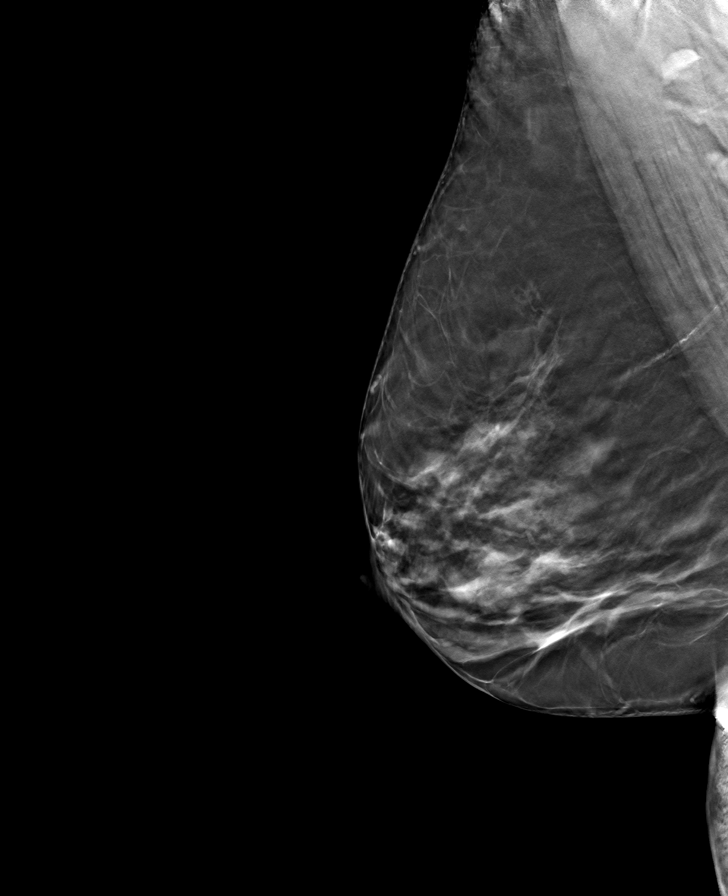

[R CC tomo · tomo slice 32/63.0]
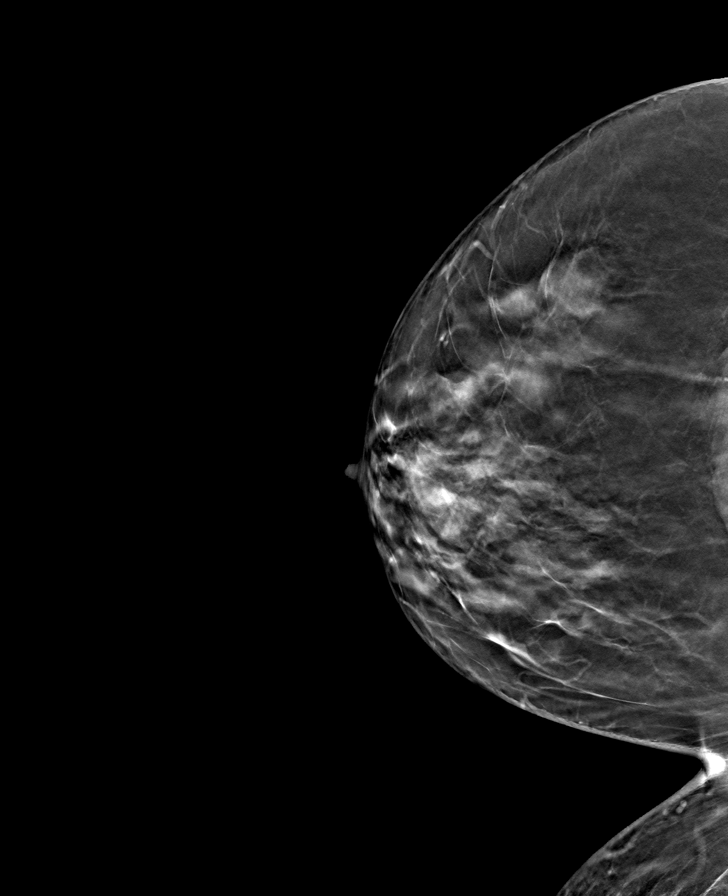

[8 of 24 positions shown; findings below may reference images not displayed]

ACR Breast Density Category c: The breast tissue is heterogeneously
dense, which may obscure small masses.
FINDINGS: There are no findings suspicious for malignancy.
IMPRESSION: No mammographic evidence of malignancy. A result letter of this
screening mammogram will be mailed directly to the patient.

RECOMMENDATION:
Screening mammogram in one year. (Code:Q3-W-BC3)

BI-RADS CATEGORY  1: Negative.

## 2023-06-07 DIAGNOSIS — R051 Acute cough: Secondary | ICD-10-CM | POA: Diagnosis not present

## 2023-06-07 DIAGNOSIS — Z20822 Contact with and (suspected) exposure to covid-19: Secondary | ICD-10-CM | POA: Diagnosis not present

## 2023-06-07 DIAGNOSIS — J029 Acute pharyngitis, unspecified: Secondary | ICD-10-CM | POA: Diagnosis not present

## 2023-06-19 DIAGNOSIS — M0579 Rheumatoid arthritis with rheumatoid factor of multiple sites without organ or systems involvement: Secondary | ICD-10-CM | POA: Diagnosis not present

## 2023-09-09 DIAGNOSIS — Z79899 Other long term (current) drug therapy: Secondary | ICD-10-CM | POA: Diagnosis not present

## 2023-09-09 DIAGNOSIS — M0579 Rheumatoid arthritis with rheumatoid factor of multiple sites without organ or systems involvement: Secondary | ICD-10-CM | POA: Diagnosis not present

## 2023-09-09 DIAGNOSIS — M5416 Radiculopathy, lumbar region: Secondary | ICD-10-CM | POA: Diagnosis not present

## 2023-09-09 DIAGNOSIS — M79641 Pain in right hand: Secondary | ICD-10-CM | POA: Diagnosis not present

## 2023-09-16 DIAGNOSIS — J069 Acute upper respiratory infection, unspecified: Secondary | ICD-10-CM | POA: Diagnosis not present

## 2023-09-23 DIAGNOSIS — I700 Atherosclerosis of aorta: Secondary | ICD-10-CM | POA: Diagnosis not present

## 2023-09-23 DIAGNOSIS — R059 Cough, unspecified: Secondary | ICD-10-CM | POA: Diagnosis not present

## 2023-09-23 DIAGNOSIS — J42 Unspecified chronic bronchitis: Secondary | ICD-10-CM | POA: Diagnosis not present

## 2023-09-23 DIAGNOSIS — J209 Acute bronchitis, unspecified: Secondary | ICD-10-CM | POA: Diagnosis not present

## 2023-09-23 DIAGNOSIS — Z03818 Encounter for observation for suspected exposure to other biological agents ruled out: Secondary | ICD-10-CM | POA: Diagnosis not present

## 2023-10-05 DIAGNOSIS — R051 Acute cough: Secondary | ICD-10-CM | POA: Diagnosis not present

## 2023-10-05 DIAGNOSIS — G44201 Tension-type headache, unspecified, intractable: Secondary | ICD-10-CM | POA: Diagnosis not present

## 2023-10-05 DIAGNOSIS — J40 Bronchitis, not specified as acute or chronic: Secondary | ICD-10-CM | POA: Diagnosis not present

## 2023-12-31 ENCOUNTER — Other Ambulatory Visit: Payer: Self-pay | Admitting: Family Medicine

## 2023-12-31 DIAGNOSIS — Z1231 Encounter for screening mammogram for malignant neoplasm of breast: Secondary | ICD-10-CM

## 2024-02-11 ENCOUNTER — Ambulatory Visit
Admission: RE | Admit: 2024-02-11 | Discharge: 2024-02-11 | Disposition: A | Source: Ambulatory Visit | Attending: Family Medicine | Admitting: Family Medicine

## 2024-02-11 DIAGNOSIS — Z1231 Encounter for screening mammogram for malignant neoplasm of breast: Secondary | ICD-10-CM
